# Patient Record
Sex: Female | Born: 1970 | Race: White | Hispanic: No | Marital: Married | Smoking: Never smoker
Health system: Southern US, Community
[De-identification: ages and names within clinical notes are randomized; demographics above are authoritative.]

## PROBLEM LIST (undated history)

## (undated) DIAGNOSIS — N8110 Cystocele, unspecified: Secondary | ICD-10-CM

## (undated) DIAGNOSIS — N840 Polyp of corpus uteri: Secondary | ICD-10-CM

## (undated) DIAGNOSIS — N390 Urinary tract infection, site not specified: Secondary | ICD-10-CM

## (undated) DIAGNOSIS — F419 Anxiety disorder, unspecified: Secondary | ICD-10-CM

## (undated) DIAGNOSIS — I10 Essential (primary) hypertension: Secondary | ICD-10-CM

## (undated) DIAGNOSIS — N12 Tubulo-interstitial nephritis, not specified as acute or chronic: Secondary | ICD-10-CM

## (undated) DIAGNOSIS — N920 Excessive and frequent menstruation with regular cycle: Secondary | ICD-10-CM

## (undated) DIAGNOSIS — IMO0002 Reserved for concepts with insufficient information to code with codable children: Secondary | ICD-10-CM

## (undated) DIAGNOSIS — B3731 Acute candidiasis of vulva and vagina: Secondary | ICD-10-CM

## (undated) DIAGNOSIS — M1990 Unspecified osteoarthritis, unspecified site: Secondary | ICD-10-CM

## (undated) DIAGNOSIS — N3090 Cystitis, unspecified without hematuria: Secondary | ICD-10-CM

## (undated) DIAGNOSIS — B373 Candidiasis of vulva and vagina: Secondary | ICD-10-CM

## (undated) HISTORY — PX: GYNECOLOGIC CRYOSURGERY: SHX857

## (undated) HISTORY — PX: WRIST SURGERY: SHX841

## (undated) HISTORY — PX: CHOLECYSTECTOMY: SHX55

## (undated) HISTORY — PX: CERVICAL POLYPECTOMY: SHX88

## (undated) HISTORY — PX: CARPAL TUNNEL RELEASE: SHX101

## (undated) HISTORY — PX: BLADDER SURGERY: SHX569

---

## 1999-06-12 ENCOUNTER — Other Ambulatory Visit: Admission: RE | Admit: 1999-06-12 | Discharge: 1999-06-12 | Payer: Self-pay | Admitting: Obstetrics & Gynecology

## 1999-09-02 ENCOUNTER — Inpatient Hospital Stay (HOSPITAL_COMMUNITY): Admission: AD | Admit: 1999-09-02 | Discharge: 1999-09-05 | Payer: Self-pay | Admitting: Family Medicine

## 1999-11-14 ENCOUNTER — Inpatient Hospital Stay (HOSPITAL_COMMUNITY): Admission: AD | Admit: 1999-11-14 | Discharge: 1999-11-17 | Payer: Self-pay | Admitting: Obstetrics & Gynecology

## 1999-11-17 ENCOUNTER — Encounter: Payer: Self-pay | Admitting: Obstetrics & Gynecology

## 1999-12-15 ENCOUNTER — Inpatient Hospital Stay (HOSPITAL_COMMUNITY): Admission: AD | Admit: 1999-12-15 | Discharge: 1999-12-15 | Payer: Self-pay | Admitting: Obstetrics and Gynecology

## 1999-12-27 ENCOUNTER — Inpatient Hospital Stay (HOSPITAL_COMMUNITY): Admission: AD | Admit: 1999-12-27 | Discharge: 1999-12-29 | Payer: Self-pay | Admitting: Obstetrics & Gynecology

## 2000-10-03 ENCOUNTER — Emergency Department (HOSPITAL_COMMUNITY): Admission: EM | Admit: 2000-10-03 | Discharge: 2000-10-03 | Payer: Self-pay | Admitting: Emergency Medicine

## 2001-10-21 ENCOUNTER — Encounter: Payer: Self-pay | Admitting: Specialist

## 2001-10-21 ENCOUNTER — Ambulatory Visit (HOSPITAL_COMMUNITY): Admission: RE | Admit: 2001-10-21 | Discharge: 2001-10-21 | Payer: Self-pay | Admitting: Specialist

## 2001-10-31 ENCOUNTER — Emergency Department (HOSPITAL_COMMUNITY): Admission: EM | Admit: 2001-10-31 | Discharge: 2001-10-31 | Payer: Self-pay | Admitting: *Deleted

## 2001-12-06 ENCOUNTER — Emergency Department (HOSPITAL_COMMUNITY): Admission: EM | Admit: 2001-12-06 | Discharge: 2001-12-06 | Payer: Self-pay | Admitting: Emergency Medicine

## 2002-06-03 ENCOUNTER — Ambulatory Visit (HOSPITAL_COMMUNITY): Admission: RE | Admit: 2002-06-03 | Discharge: 2002-06-03 | Payer: Self-pay | Admitting: Unknown Physician Specialty

## 2002-06-03 ENCOUNTER — Encounter: Payer: Self-pay | Admitting: Unknown Physician Specialty

## 2002-06-20 ENCOUNTER — Emergency Department (HOSPITAL_COMMUNITY): Admission: EM | Admit: 2002-06-20 | Discharge: 2002-06-20 | Payer: Self-pay | Admitting: Emergency Medicine

## 2002-10-18 ENCOUNTER — Ambulatory Visit (HOSPITAL_COMMUNITY): Admission: RE | Admit: 2002-10-18 | Discharge: 2002-10-18 | Payer: Self-pay | Admitting: Family Medicine

## 2002-10-18 ENCOUNTER — Encounter: Payer: Self-pay | Admitting: Family Medicine

## 2002-11-05 ENCOUNTER — Ambulatory Visit (HOSPITAL_COMMUNITY): Admission: RE | Admit: 2002-11-05 | Discharge: 2002-11-05 | Payer: Self-pay | Admitting: Family Medicine

## 2002-11-05 ENCOUNTER — Encounter: Payer: Self-pay | Admitting: Family Medicine

## 2003-03-27 ENCOUNTER — Emergency Department (HOSPITAL_COMMUNITY): Admission: EM | Admit: 2003-03-27 | Discharge: 2003-03-27 | Payer: Self-pay | Admitting: Emergency Medicine

## 2003-03-31 ENCOUNTER — Emergency Department (HOSPITAL_COMMUNITY): Admission: EM | Admit: 2003-03-31 | Discharge: 2003-04-02 | Payer: Self-pay | Admitting: Emergency Medicine

## 2003-06-26 ENCOUNTER — Emergency Department (HOSPITAL_COMMUNITY): Admission: EM | Admit: 2003-06-26 | Discharge: 2003-06-26 | Payer: Self-pay | Admitting: Emergency Medicine

## 2003-08-23 ENCOUNTER — Ambulatory Visit (HOSPITAL_COMMUNITY): Admission: RE | Admit: 2003-08-23 | Discharge: 2003-08-23 | Payer: Self-pay | Admitting: Internal Medicine

## 2004-01-17 ENCOUNTER — Ambulatory Visit: Payer: Self-pay | Admitting: Family Medicine

## 2004-01-31 ENCOUNTER — Ambulatory Visit: Payer: Self-pay | Admitting: Family Medicine

## 2004-02-02 ENCOUNTER — Observation Stay (HOSPITAL_COMMUNITY): Admission: EM | Admit: 2004-02-02 | Discharge: 2004-02-04 | Payer: Self-pay | Admitting: Emergency Medicine

## 2004-03-06 ENCOUNTER — Emergency Department (HOSPITAL_COMMUNITY): Admission: EM | Admit: 2004-03-06 | Discharge: 2004-03-06 | Payer: Self-pay | Admitting: Emergency Medicine

## 2004-03-26 ENCOUNTER — Ambulatory Visit: Payer: Self-pay | Admitting: Family Medicine

## 2004-04-09 ENCOUNTER — Ambulatory Visit: Payer: Self-pay | Admitting: Family Medicine

## 2004-05-28 ENCOUNTER — Ambulatory Visit: Payer: Self-pay | Admitting: Family Medicine

## 2004-07-03 ENCOUNTER — Ambulatory Visit: Payer: Self-pay | Admitting: Family Medicine

## 2004-07-05 ENCOUNTER — Emergency Department (HOSPITAL_COMMUNITY): Admission: EM | Admit: 2004-07-05 | Discharge: 2004-07-06 | Payer: Self-pay | Admitting: Emergency Medicine

## 2004-10-10 ENCOUNTER — Ambulatory Visit: Payer: Self-pay | Admitting: Family Medicine

## 2004-11-07 ENCOUNTER — Ambulatory Visit: Payer: Self-pay | Admitting: Family Medicine

## 2004-11-08 ENCOUNTER — Emergency Department (HOSPITAL_COMMUNITY): Admission: EM | Admit: 2004-11-08 | Discharge: 2004-11-08 | Payer: Self-pay | Admitting: Emergency Medicine

## 2004-11-12 ENCOUNTER — Ambulatory Visit: Payer: Self-pay | Admitting: Family Medicine

## 2004-11-15 ENCOUNTER — Ambulatory Visit (HOSPITAL_COMMUNITY): Admission: RE | Admit: 2004-11-15 | Discharge: 2004-11-15 | Payer: Self-pay | Admitting: Family Medicine

## 2004-11-19 ENCOUNTER — Ambulatory Visit: Payer: Self-pay | Admitting: Family Medicine

## 2004-12-09 ENCOUNTER — Emergency Department (HOSPITAL_COMMUNITY): Admission: EM | Admit: 2004-12-09 | Discharge: 2004-12-09 | Payer: Self-pay | Admitting: Emergency Medicine

## 2004-12-21 ENCOUNTER — Ambulatory Visit: Payer: Self-pay | Admitting: Family Medicine

## 2004-12-31 ENCOUNTER — Ambulatory Visit: Payer: Self-pay | Admitting: Family Medicine

## 2005-01-11 ENCOUNTER — Ambulatory Visit (HOSPITAL_COMMUNITY): Admission: RE | Admit: 2005-01-11 | Discharge: 2005-01-11 | Payer: Self-pay | Admitting: Surgery

## 2005-01-11 ENCOUNTER — Encounter (INDEPENDENT_AMBULATORY_CARE_PROVIDER_SITE_OTHER): Payer: Self-pay | Admitting: Specialist

## 2005-01-13 ENCOUNTER — Emergency Department (HOSPITAL_COMMUNITY): Admission: EM | Admit: 2005-01-13 | Discharge: 2005-01-14 | Payer: Self-pay | Admitting: Emergency Medicine

## 2005-01-14 ENCOUNTER — Ambulatory Visit (HOSPITAL_COMMUNITY): Admission: RE | Admit: 2005-01-14 | Discharge: 2005-01-14 | Payer: Self-pay | Admitting: General Surgery

## 2005-01-30 ENCOUNTER — Ambulatory Visit: Payer: Self-pay | Admitting: Family Medicine

## 2005-03-04 ENCOUNTER — Ambulatory Visit: Payer: Self-pay | Admitting: Family Medicine

## 2005-03-22 ENCOUNTER — Ambulatory Visit: Payer: Self-pay | Admitting: Family Medicine

## 2005-04-17 ENCOUNTER — Ambulatory Visit: Payer: Self-pay | Admitting: Family Medicine

## 2005-04-26 ENCOUNTER — Emergency Department (HOSPITAL_COMMUNITY): Admission: EM | Admit: 2005-04-26 | Discharge: 2005-04-27 | Payer: Self-pay | Admitting: Emergency Medicine

## 2005-04-30 ENCOUNTER — Emergency Department (HOSPITAL_COMMUNITY): Admission: EM | Admit: 2005-04-30 | Discharge: 2005-04-30 | Payer: Self-pay | Admitting: Emergency Medicine

## 2005-05-20 ENCOUNTER — Ambulatory Visit: Payer: Self-pay | Admitting: Family Medicine

## 2005-07-11 ENCOUNTER — Ambulatory Visit: Payer: Self-pay | Admitting: Family Medicine

## 2005-08-27 ENCOUNTER — Ambulatory Visit: Payer: Self-pay | Admitting: Family Medicine

## 2005-09-24 ENCOUNTER — Ambulatory Visit: Payer: Self-pay | Admitting: Family Medicine

## 2005-11-03 ENCOUNTER — Emergency Department (HOSPITAL_COMMUNITY): Admission: EM | Admit: 2005-11-03 | Discharge: 2005-11-03 | Payer: Self-pay | Admitting: Family Medicine

## 2005-11-07 ENCOUNTER — Emergency Department (HOSPITAL_COMMUNITY): Admission: EM | Admit: 2005-11-07 | Discharge: 2005-11-07 | Payer: Self-pay | Admitting: Family Medicine

## 2005-11-14 ENCOUNTER — Ambulatory Visit: Payer: Self-pay | Admitting: Family Medicine

## 2005-12-09 ENCOUNTER — Ambulatory Visit: Payer: Self-pay | Admitting: Family Medicine

## 2005-12-09 ENCOUNTER — Emergency Department (HOSPITAL_COMMUNITY): Admission: EM | Admit: 2005-12-09 | Discharge: 2005-12-09 | Payer: Self-pay | Admitting: Emergency Medicine

## 2005-12-10 ENCOUNTER — Emergency Department (HOSPITAL_COMMUNITY): Admission: EM | Admit: 2005-12-10 | Discharge: 2005-12-10 | Payer: Self-pay | Admitting: Emergency Medicine

## 2006-01-20 ENCOUNTER — Ambulatory Visit: Payer: Self-pay | Admitting: Family Medicine

## 2006-01-23 ENCOUNTER — Emergency Department (HOSPITAL_COMMUNITY): Admission: EM | Admit: 2006-01-23 | Discharge: 2006-01-23 | Payer: Self-pay | Admitting: Family Medicine

## 2006-01-30 ENCOUNTER — Ambulatory Visit: Payer: Self-pay | Admitting: Family Medicine

## 2006-04-06 ENCOUNTER — Emergency Department (HOSPITAL_COMMUNITY): Admission: EM | Admit: 2006-04-06 | Discharge: 2006-04-06 | Payer: Self-pay | Admitting: Family Medicine

## 2006-04-08 ENCOUNTER — Ambulatory Visit: Payer: Self-pay | Admitting: Family Medicine

## 2006-04-16 ENCOUNTER — Ambulatory Visit (HOSPITAL_COMMUNITY): Admission: RE | Admit: 2006-04-16 | Discharge: 2006-04-16 | Payer: Self-pay | Admitting: Obstetrics & Gynecology

## 2006-04-30 ENCOUNTER — Ambulatory Visit: Payer: Self-pay | Admitting: Family Medicine

## 2006-05-08 ENCOUNTER — Ambulatory Visit (HOSPITAL_COMMUNITY): Admission: RE | Admit: 2006-05-08 | Discharge: 2006-05-08 | Payer: Self-pay | Admitting: Obstetrics & Gynecology

## 2006-05-08 ENCOUNTER — Encounter (INDEPENDENT_AMBULATORY_CARE_PROVIDER_SITE_OTHER): Payer: Self-pay | Admitting: *Deleted

## 2006-05-25 ENCOUNTER — Emergency Department (HOSPITAL_COMMUNITY): Admission: EM | Admit: 2006-05-25 | Discharge: 2006-05-25 | Payer: Self-pay | Admitting: Family Medicine

## 2006-06-12 ENCOUNTER — Ambulatory Visit: Payer: Self-pay | Admitting: Family Medicine

## 2006-06-26 ENCOUNTER — Ambulatory Visit: Payer: Self-pay | Admitting: Family Medicine

## 2006-07-11 ENCOUNTER — Ambulatory Visit: Payer: Self-pay | Admitting: Family Medicine

## 2006-07-24 ENCOUNTER — Ambulatory Visit: Payer: Self-pay | Admitting: Family Medicine

## 2006-09-07 ENCOUNTER — Emergency Department (HOSPITAL_COMMUNITY): Admission: EM | Admit: 2006-09-07 | Discharge: 2006-09-07 | Payer: Self-pay | Admitting: Emergency Medicine

## 2006-09-22 ENCOUNTER — Emergency Department (HOSPITAL_COMMUNITY): Admission: EM | Admit: 2006-09-22 | Discharge: 2006-09-22 | Payer: Self-pay | Admitting: Emergency Medicine

## 2006-11-20 ENCOUNTER — Emergency Department (HOSPITAL_COMMUNITY): Admission: EM | Admit: 2006-11-20 | Discharge: 2006-11-20 | Payer: Self-pay | Admitting: Emergency Medicine

## 2006-12-11 ENCOUNTER — Emergency Department (HOSPITAL_COMMUNITY): Admission: EM | Admit: 2006-12-11 | Discharge: 2006-12-11 | Payer: Self-pay | Admitting: Emergency Medicine

## 2007-01-18 ENCOUNTER — Emergency Department (HOSPITAL_COMMUNITY): Admission: EM | Admit: 2007-01-18 | Discharge: 2007-01-18 | Payer: Self-pay | Admitting: Family Medicine

## 2007-01-21 ENCOUNTER — Emergency Department (HOSPITAL_COMMUNITY): Admission: EM | Admit: 2007-01-21 | Discharge: 2007-01-21 | Payer: Self-pay | Admitting: Emergency Medicine

## 2007-04-01 ENCOUNTER — Emergency Department (HOSPITAL_COMMUNITY): Admission: EM | Admit: 2007-04-01 | Discharge: 2007-04-01 | Payer: Self-pay | Admitting: Emergency Medicine

## 2007-05-10 ENCOUNTER — Emergency Department (HOSPITAL_COMMUNITY): Admission: EM | Admit: 2007-05-10 | Discharge: 2007-05-10 | Payer: Self-pay | Admitting: Emergency Medicine

## 2007-07-14 ENCOUNTER — Emergency Department (HOSPITAL_COMMUNITY): Admission: EM | Admit: 2007-07-14 | Discharge: 2007-07-14 | Payer: Self-pay | Admitting: Emergency Medicine

## 2007-07-19 ENCOUNTER — Emergency Department (HOSPITAL_COMMUNITY): Admission: EM | Admit: 2007-07-19 | Discharge: 2007-07-19 | Payer: Self-pay | Admitting: Family Medicine

## 2007-11-25 ENCOUNTER — Emergency Department (HOSPITAL_BASED_OUTPATIENT_CLINIC_OR_DEPARTMENT_OTHER): Admission: EM | Admit: 2007-11-25 | Discharge: 2007-11-25 | Payer: Self-pay | Admitting: Emergency Medicine

## 2008-01-03 ENCOUNTER — Emergency Department (HOSPITAL_BASED_OUTPATIENT_CLINIC_OR_DEPARTMENT_OTHER): Admission: EM | Admit: 2008-01-03 | Discharge: 2008-01-03 | Payer: Self-pay | Admitting: Emergency Medicine

## 2008-01-14 ENCOUNTER — Emergency Department (HOSPITAL_BASED_OUTPATIENT_CLINIC_OR_DEPARTMENT_OTHER): Admission: EM | Admit: 2008-01-14 | Discharge: 2008-01-14 | Payer: Self-pay | Admitting: Emergency Medicine

## 2008-03-28 ENCOUNTER — Emergency Department (HOSPITAL_BASED_OUTPATIENT_CLINIC_OR_DEPARTMENT_OTHER): Admission: EM | Admit: 2008-03-28 | Discharge: 2008-03-28 | Payer: Self-pay | Admitting: Emergency Medicine

## 2008-04-07 ENCOUNTER — Ambulatory Visit: Payer: Self-pay | Admitting: Diagnostic Radiology

## 2008-04-07 ENCOUNTER — Emergency Department (HOSPITAL_BASED_OUTPATIENT_CLINIC_OR_DEPARTMENT_OTHER): Admission: EM | Admit: 2008-04-07 | Discharge: 2008-04-07 | Payer: Self-pay | Admitting: Emergency Medicine

## 2008-04-29 ENCOUNTER — Emergency Department (HOSPITAL_BASED_OUTPATIENT_CLINIC_OR_DEPARTMENT_OTHER): Admission: EM | Admit: 2008-04-29 | Discharge: 2008-04-29 | Payer: Self-pay | Admitting: Emergency Medicine

## 2008-05-08 ENCOUNTER — Emergency Department (HOSPITAL_BASED_OUTPATIENT_CLINIC_OR_DEPARTMENT_OTHER): Admission: EM | Admit: 2008-05-08 | Discharge: 2008-05-08 | Payer: Self-pay | Admitting: Emergency Medicine

## 2008-06-12 ENCOUNTER — Emergency Department (HOSPITAL_BASED_OUTPATIENT_CLINIC_OR_DEPARTMENT_OTHER): Admission: EM | Admit: 2008-06-12 | Discharge: 2008-06-12 | Payer: Self-pay | Admitting: Emergency Medicine

## 2008-06-24 ENCOUNTER — Emergency Department (HOSPITAL_BASED_OUTPATIENT_CLINIC_OR_DEPARTMENT_OTHER): Admission: EM | Admit: 2008-06-24 | Discharge: 2008-06-24 | Payer: Self-pay | Admitting: Emergency Medicine

## 2008-07-26 ENCOUNTER — Emergency Department (HOSPITAL_BASED_OUTPATIENT_CLINIC_OR_DEPARTMENT_OTHER): Admission: EM | Admit: 2008-07-26 | Discharge: 2008-07-26 | Payer: Self-pay | Admitting: Emergency Medicine

## 2008-08-09 ENCOUNTER — Emergency Department (HOSPITAL_BASED_OUTPATIENT_CLINIC_OR_DEPARTMENT_OTHER): Admission: EM | Admit: 2008-08-09 | Discharge: 2008-08-09 | Payer: Self-pay | Admitting: Emergency Medicine

## 2008-09-24 ENCOUNTER — Emergency Department (HOSPITAL_BASED_OUTPATIENT_CLINIC_OR_DEPARTMENT_OTHER): Admission: EM | Admit: 2008-09-24 | Discharge: 2008-09-24 | Payer: Self-pay | Admitting: Emergency Medicine

## 2008-10-05 ENCOUNTER — Emergency Department (HOSPITAL_BASED_OUTPATIENT_CLINIC_OR_DEPARTMENT_OTHER): Admission: EM | Admit: 2008-10-05 | Discharge: 2008-10-05 | Payer: Self-pay | Admitting: Emergency Medicine

## 2008-11-30 ENCOUNTER — Emergency Department (HOSPITAL_BASED_OUTPATIENT_CLINIC_OR_DEPARTMENT_OTHER): Admission: EM | Admit: 2008-11-30 | Discharge: 2008-11-30 | Payer: Self-pay | Admitting: Emergency Medicine

## 2008-12-20 ENCOUNTER — Emergency Department (HOSPITAL_BASED_OUTPATIENT_CLINIC_OR_DEPARTMENT_OTHER): Admission: EM | Admit: 2008-12-20 | Discharge: 2008-12-20 | Payer: Self-pay | Admitting: Emergency Medicine

## 2008-12-30 ENCOUNTER — Emergency Department (HOSPITAL_BASED_OUTPATIENT_CLINIC_OR_DEPARTMENT_OTHER): Admission: EM | Admit: 2008-12-30 | Discharge: 2008-12-30 | Payer: Self-pay | Admitting: Emergency Medicine

## 2009-01-05 ENCOUNTER — Emergency Department (HOSPITAL_BASED_OUTPATIENT_CLINIC_OR_DEPARTMENT_OTHER): Admission: EM | Admit: 2009-01-05 | Discharge: 2009-01-05 | Payer: Self-pay | Admitting: Emergency Medicine

## 2009-01-17 ENCOUNTER — Emergency Department (HOSPITAL_BASED_OUTPATIENT_CLINIC_OR_DEPARTMENT_OTHER): Admission: EM | Admit: 2009-01-17 | Discharge: 2009-01-17 | Payer: Self-pay | Admitting: Emergency Medicine

## 2009-01-17 ENCOUNTER — Ambulatory Visit: Payer: Self-pay | Admitting: Diagnostic Radiology

## 2009-03-02 ENCOUNTER — Emergency Department (HOSPITAL_BASED_OUTPATIENT_CLINIC_OR_DEPARTMENT_OTHER): Admission: EM | Admit: 2009-03-02 | Discharge: 2009-03-02 | Payer: Self-pay | Admitting: Emergency Medicine

## 2009-03-11 ENCOUNTER — Ambulatory Visit: Payer: Self-pay | Admitting: Diagnostic Radiology

## 2009-03-11 ENCOUNTER — Emergency Department (HOSPITAL_BASED_OUTPATIENT_CLINIC_OR_DEPARTMENT_OTHER): Admission: EM | Admit: 2009-03-11 | Discharge: 2009-03-11 | Payer: Self-pay | Admitting: Emergency Medicine

## 2009-06-08 ENCOUNTER — Emergency Department (HOSPITAL_BASED_OUTPATIENT_CLINIC_OR_DEPARTMENT_OTHER): Admission: EM | Admit: 2009-06-08 | Discharge: 2009-06-08 | Payer: Self-pay | Admitting: Emergency Medicine

## 2009-06-11 ENCOUNTER — Emergency Department (HOSPITAL_BASED_OUTPATIENT_CLINIC_OR_DEPARTMENT_OTHER): Admission: EM | Admit: 2009-06-11 | Discharge: 2009-06-11 | Payer: Self-pay | Admitting: Emergency Medicine

## 2009-07-08 ENCOUNTER — Emergency Department (HOSPITAL_BASED_OUTPATIENT_CLINIC_OR_DEPARTMENT_OTHER): Admission: EM | Admit: 2009-07-08 | Discharge: 2009-07-08 | Payer: Self-pay | Admitting: Emergency Medicine

## 2009-08-24 ENCOUNTER — Emergency Department (HOSPITAL_BASED_OUTPATIENT_CLINIC_OR_DEPARTMENT_OTHER): Admission: EM | Admit: 2009-08-24 | Discharge: 2009-08-24 | Payer: Self-pay | Admitting: Emergency Medicine

## 2009-08-29 ENCOUNTER — Emergency Department (HOSPITAL_BASED_OUTPATIENT_CLINIC_OR_DEPARTMENT_OTHER)
Admission: EM | Admit: 2009-08-29 | Discharge: 2009-08-29 | Payer: Self-pay | Source: Home / Self Care | Admitting: Emergency Medicine

## 2009-09-10 ENCOUNTER — Emergency Department (HOSPITAL_BASED_OUTPATIENT_CLINIC_OR_DEPARTMENT_OTHER)
Admission: EM | Admit: 2009-09-10 | Discharge: 2009-09-10 | Payer: Self-pay | Source: Home / Self Care | Admitting: Emergency Medicine

## 2009-10-28 ENCOUNTER — Emergency Department (HOSPITAL_BASED_OUTPATIENT_CLINIC_OR_DEPARTMENT_OTHER): Admission: EM | Admit: 2009-10-28 | Discharge: 2009-10-28 | Payer: Self-pay | Admitting: Emergency Medicine

## 2009-11-13 ENCOUNTER — Emergency Department (HOSPITAL_BASED_OUTPATIENT_CLINIC_OR_DEPARTMENT_OTHER): Admission: EM | Admit: 2009-11-13 | Discharge: 2009-11-13 | Payer: Self-pay | Admitting: Emergency Medicine

## 2009-11-17 ENCOUNTER — Emergency Department (HOSPITAL_BASED_OUTPATIENT_CLINIC_OR_DEPARTMENT_OTHER): Admission: EM | Admit: 2009-11-17 | Discharge: 2009-11-17 | Payer: Self-pay | Admitting: Emergency Medicine

## 2009-12-15 ENCOUNTER — Emergency Department (HOSPITAL_BASED_OUTPATIENT_CLINIC_OR_DEPARTMENT_OTHER): Admission: EM | Admit: 2009-12-15 | Discharge: 2009-12-15 | Payer: Self-pay | Admitting: Emergency Medicine

## 2009-12-15 ENCOUNTER — Ambulatory Visit: Payer: Self-pay | Admitting: Diagnostic Radiology

## 2010-01-03 ENCOUNTER — Inpatient Hospital Stay (HOSPITAL_COMMUNITY): Admission: AD | Admit: 2010-01-03 | Discharge: 2010-01-03 | Payer: Self-pay | Admitting: Obstetrics & Gynecology

## 2010-02-09 ENCOUNTER — Ambulatory Visit (HOSPITAL_COMMUNITY)
Admission: RE | Admit: 2010-02-09 | Discharge: 2010-02-09 | Payer: Self-pay | Source: Home / Self Care | Attending: Obstetrics | Admitting: Obstetrics

## 2010-03-06 ENCOUNTER — Observation Stay (HOSPITAL_COMMUNITY)
Admission: EM | Admit: 2010-03-06 | Discharge: 2010-03-07 | Payer: Self-pay | Source: Home / Self Care | Admitting: Emergency Medicine

## 2010-03-07 ENCOUNTER — Encounter (INDEPENDENT_AMBULATORY_CARE_PROVIDER_SITE_OTHER): Payer: Self-pay | Admitting: Emergency Medicine

## 2010-03-10 ENCOUNTER — Emergency Department (HOSPITAL_BASED_OUTPATIENT_CLINIC_OR_DEPARTMENT_OTHER)
Admission: EM | Admit: 2010-03-10 | Discharge: 2010-03-10 | Payer: Self-pay | Source: Home / Self Care | Admitting: Emergency Medicine

## 2010-03-12 LAB — URINALYSIS, ROUTINE W REFLEX MICROSCOPIC
Bilirubin Urine: NEGATIVE
Bilirubin Urine: NEGATIVE
Hgb urine dipstick: NEGATIVE
Ketones, ur: NEGATIVE mg/dL
Ketones, ur: NEGATIVE mg/dL
Nitrite: NEGATIVE
Nitrite: NEGATIVE
Protein, ur: NEGATIVE mg/dL
Protein, ur: NEGATIVE mg/dL
Specific Gravity, Urine: 1.008 (ref 1.005–1.030)
Specific Gravity, Urine: 1.023 (ref 1.005–1.030)
Urine Glucose, Fasting: NEGATIVE mg/dL
Urine Glucose, Fasting: NEGATIVE mg/dL
Urobilinogen, UA: 0.2 mg/dL (ref 0.0–1.0)
Urobilinogen, UA: 0.2 mg/dL (ref 0.0–1.0)
pH: 6 (ref 5.0–8.0)
pH: 5.5 (ref 5.0–8.0)

## 2010-03-12 LAB — DIFFERENTIAL
Basophils Absolute: 0 10*3/uL (ref 0.0–0.1)
Basophils Relative: 0 % (ref 0–1)
Eosinophils Absolute: 0.1 10*3/uL (ref 0.0–0.7)
Eosinophils Relative: 2 % (ref 0–5)
Lymphocytes Relative: 33 % (ref 12–46)
Lymphs Abs: 2.5 10*3/uL (ref 0.7–4.0)
Monocytes Absolute: 0.5 10*3/uL (ref 0.1–1.0)
Monocytes Relative: 7 % (ref 3–12)
Neutro Abs: 4.5 10*3/uL (ref 1.7–7.7)
Neutrophils Relative %: 59 % (ref 43–77)

## 2010-03-12 LAB — CBC
HCT: 39.4 % (ref 36.0–46.0)
Hemoglobin: 13 g/dL (ref 12.0–15.0)
MCH: 29.3 pg (ref 26.0–34.0)
MCHC: 33 g/dL (ref 30.0–36.0)
MCV: 88.9 fL (ref 78.0–100.0)
Platelets: 197 10*3/uL (ref 150–400)
RBC: 4.43 MIL/uL (ref 3.87–5.11)
RDW: 13 % (ref 11.5–15.5)
WBC: 7.7 10*3/uL (ref 4.0–10.5)

## 2010-03-12 LAB — URINE MICROSCOPIC-ADD ON

## 2010-03-12 LAB — POCT CARDIAC MARKERS
CKMB, poc: <1 ng/mL — ABNORMAL LOW (ref 1.0–8.0)
CKMB, poc: <1 ng/mL — ABNORMAL LOW (ref 1.0–8.0)
CKMB, poc: <1 ng/mL — ABNORMAL LOW (ref 1.0–8.0)
Myoglobin, poc: 60.7 ng/mL (ref 12–200)
Myoglobin, poc: 57.7 ng/mL (ref 12–200)
Myoglobin, poc: 56.1 ng/mL (ref 12–200)
Troponin i, poc: <0.05 ng/mL (ref 0.00–0.09)
Troponin i, poc: <0.05 ng/mL (ref 0.00–0.09)
Troponin i, poc: <0.05 ng/mL (ref 0.00–0.09)

## 2010-03-12 LAB — POCT I-STAT, CHEM 8
BUN: 9 mg/dL (ref 6–23)
Calcium, Ion: 1.17 mmol/L (ref 1.12–1.32)
Chloride: 108 mEq/L (ref 96–112)
Creatinine, Ser: 1 mg/dL (ref 0.4–1.2)
Glucose, Bld: 96 mg/dL (ref 70–99)
HCT: 40 % (ref 36.0–46.0)
Hemoglobin: 13.6 g/dL (ref 12.0–15.0)
Potassium: 3.8 mEq/L (ref 3.5–5.1)
Sodium: 143 mEq/L (ref 135–145)
TCO2: 26 mmol/L (ref 0–100)

## 2010-03-12 LAB — PREGNANCY, URINE
Preg Test, Ur: NEGATIVE
Preg Test, Ur: NEGATIVE

## 2010-03-14 LAB — URINE CULTURE
Colony Count: NO GROWTH
Culture  Setup Time: 201201150049
Culture: NO GROWTH

## 2010-04-02 ENCOUNTER — Encounter: Payer: Self-pay | Admitting: Emergency Medicine

## 2010-04-02 ENCOUNTER — Ambulatory Visit (INDEPENDENT_AMBULATORY_CARE_PROVIDER_SITE_OTHER): Payer: Medicaid Other | Admitting: Emergency Medicine

## 2010-04-02 DIAGNOSIS — J209 Acute bronchitis, unspecified: Secondary | ICD-10-CM | POA: Insufficient documentation

## 2010-04-02 DIAGNOSIS — R109 Unspecified abdominal pain: Secondary | ICD-10-CM

## 2010-04-02 LAB — CONVERTED CEMR LAB
Bilirubin Urine: NEGATIVE
Blood in Urine, dipstick: NEGATIVE
Glucose, Urine, Semiquant: NEGATIVE
Ketones, urine, test strip: NEGATIVE
Nitrite: NEGATIVE
Protein, U semiquant: NEGATIVE
Specific Gravity, Urine: 1.02
Urobilinogen, UA: 1
pH: 7

## 2010-04-03 ENCOUNTER — Encounter: Payer: Self-pay | Admitting: Emergency Medicine

## 2010-04-04 ENCOUNTER — Telehealth (INDEPENDENT_AMBULATORY_CARE_PROVIDER_SITE_OTHER): Payer: Self-pay | Admitting: *Deleted

## 2010-04-07 ENCOUNTER — Emergency Department (HOSPITAL_BASED_OUTPATIENT_CLINIC_OR_DEPARTMENT_OTHER)
Admission: EM | Admit: 2010-04-07 | Discharge: 2010-04-07 | Disposition: A | Discharge status: Home / Self Care | Payer: Medicaid Other | Attending: Emergency Medicine | Admitting: Emergency Medicine

## 2010-04-07 DIAGNOSIS — N12 Tubulo-interstitial nephritis, not specified as acute or chronic: Secondary | ICD-10-CM | POA: Insufficient documentation

## 2010-04-07 DIAGNOSIS — M549 Dorsalgia, unspecified: Secondary | ICD-10-CM | POA: Insufficient documentation

## 2010-04-07 LAB — BASIC METABOLIC PANEL
CO2: 26 mEq/L (ref 19–32)
Calcium: 9.1 mg/dL (ref 8.4–10.5)
GFR calc Af Amer: >60 mL/min (ref >60)
Potassium: 4.2 mEq/L (ref 3.5–5.1)
Sodium: 149 mEq/L — ABNORMAL HIGH (ref 135–145)

## 2010-04-07 LAB — URINALYSIS, ROUTINE W REFLEX MICROSCOPIC
Ketones, ur: NEGATIVE mg/dL
Protein, ur: NEGATIVE mg/dL
Urine Glucose, Fasting: NEGATIVE mg/dL
pH: 6 (ref 5.0–8.0)

## 2010-04-07 LAB — URINE MICROSCOPIC-ADD ON

## 2010-04-07 LAB — DIFFERENTIAL
Basophils Absolute: 0 10*3/uL (ref 0.0–0.1)
Basophils Relative: 0 % (ref 0–1)
Eosinophils Absolute: 0.1 10*3/uL (ref 0.0–0.7)
Eosinophils Relative: 2 % (ref 0–5)
Monocytes Relative: 9 % (ref 3–12)
Neutro Abs: 3.5 10*3/uL (ref 1.7–7.7)

## 2010-04-07 LAB — CBC
HCT: 37.6 % (ref 36.0–46.0)
Hemoglobin: 12.6 g/dL (ref 12.0–15.0)
MCHC: 33.5 g/dL (ref 30.0–36.0)
RDW: 12.7 % (ref 11.5–15.5)
WBC: 6.8 10*3/uL (ref 4.0–10.5)

## 2010-04-07 LAB — PREGNANCY, URINE: Preg Test, Ur: NEGATIVE

## 2010-04-12 NOTE — Progress Notes (Signed)
  Phone Note Outgoing Call   Call placed by: Clemens Catholic LPN,  April 04, 2010 1:54 PM Call placed to: pts daughter Summary of Call: call back: spoke to pts daughter she states that the pt just picked up her ABT from the pharmacy to start it today. advised her to have pt tocall back if any questions or concerns. Initial call taken by: Clemens Catholic LPN,  April 04, 2010 1:55 PM

## 2010-04-12 NOTE — Assessment & Plan Note (Signed)
Summary: Sick,dry cough,runny nose/wse (rm 3)   Vital Signs:  Patient Profile:   39 Years Old Female CC:      dry cough, runny nose, right flank pain x 3 days Height:     63 inches Weight:      236 pounds O2 Sat:      98 % O2 treatment:    Room Air Temp:     99.0 degrees F oral Pulse rate:   99 / minute Resp:     16 per minute BP sitting:   128 / 84  (left arm) Cuff size:   large  Pt. in pain?   yes    Location:   right flank    Intensity:   7    Type:       sharp  Vitals Entered By: Lajean Saver RN (April 02, 2010 5:04 PM)                   Updated Prior Medication List: No Medications Current Allergies: ASAHistory of Present Illness Chief Complaint: dry cough, runny nose, right flank pain x 3 days History of Present Illness: Pt complains of  3 days of chest and sinus congestion. Scant colored sputum and nasal mucus. No sore throat. + hacking, dry cough, worse at night. No dyspnea. No chest pain. No wheezing.  No nausea No vomiting. No fever, No chills. Also c/o R flank pain for 3 days.  7/10. With dysuria yesterday, but not today. Hx of "kidney/bladder infections " in the past. Her renal MD is Dr. Annabell Howells. LMP nl 3 wks ago.  REVIEW OF SYSTEMS Constitutional Symptoms      Denies fever, chills, night sweats, weight loss, weight gain, and fatigue.  Eyes       Denies change in vision, eye pain, eye discharge, glasses, contact lenses, and eye surgery. Ear/Nose/Throat/Mouth       Complains of frequent runny nose.      Denies hearing loss/aids, change in hearing, ear pain, ear discharge, dizziness, frequent nose bleeds, sinus problems, sore throat, hoarseness, and tooth pain or bleeding.  Respiratory       Complains of dry cough.      Denies productive cough, wheezing, shortness of breath, asthma, bronchitis, and emphysema/COPD.  Cardiovascular       Denies murmurs, chest pain, and tires easily with exhertion.    Gastrointestinal       Complains of  nausea/vomiting.      Denies stomach pain, diarrhea, constipation, blood in bowel movements, and indigestion.      Comments: nausea only Genitourniary       Denies painful urination, kidney stones, and loss of urinary control. Neurological       Denies paralysis, seizures, and fainting/blackouts. Musculoskeletal       Denies muscle pain, joint pain, joint stiffness, decreased range of motion, redness, swelling, muscle weakness, and gout.      Comments: right flank pain Skin       Denies bruising, unusual mles/lumps or sores, and hair/skin or nail changes.  Psych       Denies mood changes, temper/anger issues, anxiety/stress, speech problems, depression, and sleep problems. Other Comments: Patient c/o dry cough and runny nose 3 days ago. She also developed right flank pain about 3 days ago. She has a hx of frequent kidney infections. Denies dysuria now.   Past History:  Past Medical History: Frequent kindey/bladder infections/misshapen bladder- Renal MD: Dr. Annabell Howells  Past Surgical History: Cholecystectomy polyps removed from  uterus  Family History: none  Social History: Married Never Smoked Alcohol use-no Drug use-no Dips snuffSmoking Status:  never Drug Use:  no Physical Exam General appearance: obese, well developed, well nourished, no acute distress. Fatigued. coughing. Here with husband. Head: normocephalic, atraumatic Eyes: conjunctivae and lids normal Pupils: equal, round, reactive to light Ears: normal, no lesions or deformities Nasal: swollen red turbinates with congestion Oral/Pharynx: tongue normal, posterior pharynx without erythema or exudate Neck: neck supple,  trachea midline, no masses Chest/Lungs: scattered rhonchi. But no wheezes or rales. Heart: regular rate and  rhythm, no murmur Abdomen: soft, non-tender without obvious organomegaly Back: tender musculature right thoracic Skin: no obvious rashes or lesions Minimal tenderness R paralumbar and R  CVA Assessment New Problems: ACUTE BRONCHITIS (ICD-466.0) FLANK PAIN, RIGHT (ICD-789.09)  Flank pain likely muscular, but UA is cloudy with trace leukocytes. So, possible mild R pyelonephritis.  Patient Education: Patient and/or caregiver instructed in the following: rest, fluids, Tylenol prn.  Plan New Medications/Changes: CIPRO 500 MG TABS (CIPROFLOXACIN HCL) 1 BID for 10 days  #20 x 0, 04/02/2010, Lajean Manes MD PROMETHAZINE-CODEINE 6.25-10 MG/5ML SYRP (PROMETHAZINE-CODEINE) 1 or 2 tsp hs as needed cough  #4 oz x 0, 04/02/2010, Lajean Manes MD  New Orders: T-Culture, Urine [[id number]] Est. Patient Level IV [09811] UA Dipstick w/o Micro (automated)  [81003] Planning Comments:   Send off urine culture. Discussed rx options. Risks, benefits, alternatives discussed. Pt and husband voiced understanding and agreement. Pt and husband agree with plans.----Intitially, I wrote for Levaquin 500 mg. daily for 10 days. This required PA, per phone message from pharmacy, so Levaquin changed to Cipro at pt's request. Risks, benefits, alternatives discussed. Pt voiced understanding and agreement.  Follow Up: Follow up in 2-3 days if no improvement, Follow up with Primary Physician Follow Up: also with Dr. Annabell Howells, her urologist within 1 week.  The patient and/or caregiver has been counseled thoroughly with regard to medications prescribed including dosage, schedule, interactions, rationale for use, and possible side effects and they verbalize understanding.  Diagnoses and expected course of recovery discussed and will return if not improved as expected or if the condition worsens. Patient and/or caregiver verbalized understanding.  Prescriptions: CIPRO 500 MG TABS (CIPROFLOXACIN HCL) 1 BID for 10 days  #20 x 0   Entered and Authorized by:   Lajean Manes MD   Signed by:   Lajean Manes MD on 04/02/2010   Method used:   Handwritten   RxID:   [id number] PROMETHAZINE-CODEINE 6.25-10 MG/5ML SYRP  (PROMETHAZINE-CODEINE) 1 or 2 tsp hs as needed cough  #4 oz x 0   Entered and Authorized by:   Lajean Manes MD   Signed by:   Lajean Manes MD on 04/02/2010   Method used:   Handwritten   RxID:   [id number]   Orders Added: 1)  T-Culture, Urine [[id number]] 2)  Est. Patient Level IV [64403] 3)  UA Dipstick w/o Micro (automated)  [81003]    Laboratory Results   Urine Tests  Date/Time Received: April 02, 2010 5:18 PM  Date/Time Reported: April 02, 2010 5:18 PM   Routine Urinalysis   Color: yellow Appearance: Cloudy Glucose: negative   (Normal Range: Negative) Bilirubin: negative   (Normal Range: Negative) Ketone: negative   (Normal Range: Negative) Spec. Gravity: 1.020   (Normal Range: 1.003-1.035) Blood: negative   (Normal Range: Negative) pH: 7.0   (Normal Range: 5.0-8.0) Protein: negative   (Normal Range: Negative) Urobilinogen: 1.0   (Normal Range: 0-1) Nitrite:  negative   (Normal Range: Negative) Leukocyte Esterace: trace   (Normal Range: Negative)

## 2010-05-08 ENCOUNTER — Emergency Department (HOSPITAL_BASED_OUTPATIENT_CLINIC_OR_DEPARTMENT_OTHER)
Admission: EM | Admit: 2010-05-08 | Discharge: 2010-05-08 | Disposition: A | Discharge status: Home / Self Care | Payer: Medicaid Other | Attending: Emergency Medicine | Admitting: Emergency Medicine

## 2010-05-08 DIAGNOSIS — W19XXXA Unspecified fall, initial encounter: Secondary | ICD-10-CM | POA: Insufficient documentation

## 2010-05-08 DIAGNOSIS — T148XXA Other injury of unspecified body region, initial encounter: Secondary | ICD-10-CM | POA: Insufficient documentation

## 2010-05-08 DIAGNOSIS — G8929 Other chronic pain: Secondary | ICD-10-CM | POA: Insufficient documentation

## 2010-05-08 LAB — URINALYSIS, ROUTINE W REFLEX MICROSCOPIC
Bilirubin Urine: NEGATIVE
Bilirubin Urine: NEGATIVE
Glucose, UA: NEGATIVE mg/dL
Ketones, ur: NEGATIVE mg/dL
Ketones, ur: NEGATIVE mg/dL
Leukocytes, UA: NEGATIVE
Nitrite: NEGATIVE
Protein, ur: NEGATIVE mg/dL
Specific Gravity, Urine: >1.03 — ABNORMAL HIGH (ref 1.005–1.030)
Specific Gravity, Urine: 1.003 — ABNORMAL LOW (ref 1.005–1.030)
Urobilinogen, UA: 0.2 mg/dL (ref 0.0–1.0)

## 2010-05-08 LAB — URINE CULTURE: Culture  Setup Time: 201111100442

## 2010-05-09 LAB — URINALYSIS, ROUTINE W REFLEX MICROSCOPIC
Bilirubin Urine: NEGATIVE
Hgb urine dipstick: NEGATIVE
Specific Gravity, Urine: 1.016 (ref 1.005–1.030)
pH: 6 (ref 5.0–8.0)

## 2010-05-10 LAB — DIFFERENTIAL
Eosinophils Relative: 1 % (ref 0–5)
Lymphocytes Relative: 26 % (ref 12–46)
Lymphocytes Relative: 35 % (ref 12–46)
Lymphs Abs: 2.1 10*3/uL (ref 0.7–4.0)
Monocytes Absolute: 0.8 10*3/uL (ref 0.1–1.0)
Monocytes Relative: 9 % (ref 3–12)
Neutro Abs: 4.3 10*3/uL (ref 1.7–7.7)
Neutrophils Relative %: 67 % (ref 43–77)

## 2010-05-10 LAB — URINE CULTURE

## 2010-05-10 LAB — URINALYSIS, ROUTINE W REFLEX MICROSCOPIC
Glucose, UA: NEGATIVE mg/dL
Glucose, UA: NEGATIVE mg/dL
Glucose, UA: NEGATIVE mg/dL
Hgb urine dipstick: NEGATIVE
Hgb urine dipstick: NEGATIVE
Specific Gravity, Urine: 1.016 (ref 1.005–1.030)
Specific Gravity, Urine: 1.017 (ref 1.005–1.030)
Urobilinogen, UA: 0.2 mg/dL (ref 0.0–1.0)
pH: 5.5 (ref 5.0–8.0)
pH: 6 (ref 5.0–8.0)

## 2010-05-10 LAB — WET PREP, GENITAL
Trich, Wet Prep: NONE SEEN
Yeast Wet Prep HPF POC: NONE SEEN

## 2010-05-10 LAB — CBC
HCT: 37.8 % (ref 36.0–46.0)
Hemoglobin: 13.1 g/dL (ref 12.0–15.0)
MCV: 88.3 fL (ref 78.0–100.0)
Platelets: 226 10*3/uL (ref 150–400)
RBC: 4.28 MIL/uL (ref 3.87–5.11)
RBC: 4.32 MIL/uL (ref 3.87–5.11)
WBC: 8 10*3/uL (ref 4.0–10.5)
WBC: 8.1 10*3/uL (ref 4.0–10.5)

## 2010-05-10 LAB — BASIC METABOLIC PANEL
Chloride: 110 mEq/L (ref 96–112)
Creatinine, Ser: 0.9 mg/dL (ref 0.4–1.2)
GFR calc Af Amer: >60 mL/min (ref >60)
Potassium: 3.9 mEq/L (ref 3.5–5.1)
Sodium: 144 mEq/L (ref 135–145)

## 2010-05-10 LAB — GC/CHLAMYDIA PROBE AMP, GENITAL: GC Probe Amp, Genital: NEGATIVE

## 2010-05-10 LAB — URINE MICROSCOPIC-ADD ON

## 2010-05-10 LAB — PREGNANCY, URINE: Preg Test, Ur: NEGATIVE

## 2010-05-12 LAB — URINALYSIS, ROUTINE W REFLEX MICROSCOPIC
Bilirubin Urine: NEGATIVE
Glucose, UA: NEGATIVE mg/dL
Specific Gravity, Urine: 1.014 (ref 1.005–1.030)
Urobilinogen, UA: 1 mg/dL (ref 0.0–1.0)

## 2010-05-12 LAB — URINE MICROSCOPIC-ADD ON

## 2010-05-13 LAB — DIFFERENTIAL
Basophils Absolute: 0 10*3/uL (ref 0.0–0.1)
Basophils Relative: 1 % (ref 0–1)
Eosinophils Absolute: 0.1 10*3/uL (ref 0.0–0.7)
Monocytes Absolute: 0.4 10*3/uL (ref 0.1–1.0)
Monocytes Relative: 6 % (ref 3–12)
Neutro Abs: 5.1 10*3/uL (ref 1.7–7.7)
Neutrophils Relative %: 68 % (ref 43–77)

## 2010-05-13 LAB — BASIC METABOLIC PANEL
BUN: 7 mg/dL (ref 6–23)
Calcium: 9.3 mg/dL (ref 8.4–10.5)
Creatinine, Ser: 0.8 mg/dL (ref 0.4–1.2)
GFR calc non Af Amer: >60 mL/min (ref >60)
Glucose, Bld: 145 mg/dL — ABNORMAL HIGH (ref 70–99)

## 2010-05-13 LAB — URINALYSIS, ROUTINE W REFLEX MICROSCOPIC
Bilirubin Urine: NEGATIVE
Glucose, UA: NEGATIVE mg/dL
Glucose, UA: NEGATIVE mg/dL
Hgb urine dipstick: NEGATIVE
Hgb urine dipstick: NEGATIVE
Ketones, ur: NEGATIVE mg/dL
Ketones, ur: NEGATIVE mg/dL
Nitrite: NEGATIVE
Nitrite: NEGATIVE
Protein, ur: NEGATIVE mg/dL
Specific Gravity, Urine: 1.024 (ref 1.005–1.030)
Specific Gravity, Urine: 1.024 (ref 1.005–1.030)
Urobilinogen, UA: 0.2 mg/dL (ref 0.0–1.0)
Urobilinogen, UA: 0.2 mg/dL (ref 0.0–1.0)
pH: 5.5 (ref 5.0–8.0)
pH: 6 (ref 5.0–8.0)

## 2010-05-13 LAB — URINE MICROSCOPIC-ADD ON

## 2010-05-13 LAB — CBC
MCHC: 33.9 g/dL (ref 30.0–36.0)
MCV: 90 fL (ref 78.0–100.0)
RBC: 4.63 MIL/uL (ref 3.87–5.11)
RDW: 12.1 % (ref 11.5–15.5)

## 2010-05-13 LAB — URINE CULTURE

## 2010-05-13 LAB — PREGNANCY, URINE
Preg Test, Ur: NEGATIVE
Preg Test, Ur: NEGATIVE

## 2010-05-13 LAB — WET PREP, GENITAL
Clue Cells Wet Prep HPF POC: NONE SEEN
Trich, Wet Prep: NONE SEEN

## 2010-05-15 LAB — URINALYSIS, ROUTINE W REFLEX MICROSCOPIC
Glucose, UA: NEGATIVE mg/dL
Ketones, ur: NEGATIVE mg/dL
Leukocytes, UA: NEGATIVE
Nitrite: POSITIVE — AB
Nitrite: NEGATIVE
Protein, ur: NEGATIVE mg/dL
Urobilinogen, UA: 1 mg/dL (ref 0.0–1.0)
pH: 6.5 (ref 5.0–8.0)

## 2010-05-15 LAB — WET PREP, GENITAL

## 2010-05-15 LAB — GC/CHLAMYDIA PROBE AMP, GENITAL
Chlamydia, DNA Probe: NEGATIVE
GC Probe Amp, Genital: NEGATIVE

## 2010-05-15 LAB — URINE MICROSCOPIC-ADD ON

## 2010-05-15 LAB — URINE CULTURE
Colony Count: NO GROWTH
Culture: NO GROWTH

## 2010-05-15 LAB — PREGNANCY, URINE: Preg Test, Ur: NEGATIVE

## 2010-05-16 LAB — URINE CULTURE: Colony Count: >=100000

## 2010-05-16 LAB — URINALYSIS, ROUTINE W REFLEX MICROSCOPIC
Ketones, ur: NEGATIVE mg/dL
Nitrite: POSITIVE — AB
Protein, ur: NEGATIVE mg/dL

## 2010-05-16 LAB — PREGNANCY, URINE: Preg Test, Ur: NEGATIVE

## 2010-05-26 ENCOUNTER — Emergency Department (HOSPITAL_BASED_OUTPATIENT_CLINIC_OR_DEPARTMENT_OTHER)
Admission: EM | Admit: 2010-05-26 | Discharge: 2010-05-26 | Disposition: A | Discharge status: Home / Self Care | Payer: Medicaid Other | Attending: Emergency Medicine | Admitting: Emergency Medicine

## 2010-05-26 DIAGNOSIS — G8929 Other chronic pain: Secondary | ICD-10-CM | POA: Insufficient documentation

## 2010-05-26 DIAGNOSIS — R109 Unspecified abdominal pain: Secondary | ICD-10-CM | POA: Insufficient documentation

## 2010-05-26 DIAGNOSIS — N390 Urinary tract infection, site not specified: Secondary | ICD-10-CM | POA: Insufficient documentation

## 2010-05-26 LAB — URINALYSIS, ROUTINE W REFLEX MICROSCOPIC
Bilirubin Urine: NEGATIVE
Glucose, UA: NEGATIVE mg/dL
Ketones, ur: NEGATIVE mg/dL
Nitrite: POSITIVE — AB
Protein, ur: NEGATIVE mg/dL

## 2010-05-26 LAB — PREGNANCY, URINE: Preg Test, Ur: NEGATIVE

## 2010-05-26 LAB — URINE MICROSCOPIC-ADD ON

## 2010-05-28 LAB — URINE CULTURE: Culture  Setup Time: 201203312159

## 2010-05-30 LAB — URINE CULTURE: Colony Count: 45000

## 2010-05-30 LAB — URINALYSIS, ROUTINE W REFLEX MICROSCOPIC
Bilirubin Urine: NEGATIVE
Glucose, UA: NEGATIVE mg/dL
Ketones, ur: NEGATIVE mg/dL
Ketones, ur: NEGATIVE mg/dL
Nitrite: NEGATIVE
Nitrite: NEGATIVE
Protein, ur: NEGATIVE mg/dL
Protein, ur: NEGATIVE mg/dL
Urobilinogen, UA: 0.2 mg/dL (ref 0.0–1.0)
Urobilinogen, UA: 0.2 mg/dL (ref 0.0–1.0)

## 2010-05-30 LAB — URINE MICROSCOPIC-ADD ON

## 2010-05-30 LAB — PREGNANCY, URINE: Preg Test, Ur: NEGATIVE

## 2010-05-31 LAB — URINE MICROSCOPIC-ADD ON

## 2010-05-31 LAB — URINALYSIS, ROUTINE W REFLEX MICROSCOPIC
Bilirubin Urine: NEGATIVE
Bilirubin Urine: NEGATIVE
Glucose, UA: NEGATIVE mg/dL
Glucose, UA: NEGATIVE mg/dL
Hgb urine dipstick: NEGATIVE
Leukocytes, UA: NEGATIVE
Nitrite: POSITIVE — AB
Protein, ur: NEGATIVE mg/dL
Specific Gravity, Urine: 1.023 (ref 1.005–1.030)
pH: 7 (ref 5.0–8.0)

## 2010-05-31 LAB — PREGNANCY, URINE: Preg Test, Ur: NEGATIVE

## 2010-05-31 LAB — URINE CULTURE: Colony Count: >=100000

## 2010-06-02 LAB — URINE CULTURE

## 2010-06-02 LAB — RPR: RPR Ser Ql: NONREACTIVE

## 2010-06-02 LAB — URINE MICROSCOPIC-ADD ON

## 2010-06-02 LAB — URINALYSIS, ROUTINE W REFLEX MICROSCOPIC
Nitrite: NEGATIVE
Specific Gravity, Urine: 1.02 (ref 1.005–1.030)
pH: 8 (ref 5.0–8.0)

## 2010-06-02 LAB — GC/CHLAMYDIA PROBE AMP, GENITAL
Chlamydia, DNA Probe: NEGATIVE
GC Probe Amp, Genital: NEGATIVE

## 2010-06-02 LAB — PREGNANCY, URINE: Preg Test, Ur: NEGATIVE

## 2010-06-02 LAB — WET PREP, GENITAL

## 2010-06-03 LAB — URINALYSIS, ROUTINE W REFLEX MICROSCOPIC
Glucose, UA: NEGATIVE mg/dL
Specific Gravity, Urine: 1.017 (ref 1.005–1.030)
Urobilinogen, UA: 0.2 mg/dL (ref 0.0–1.0)

## 2010-06-03 LAB — URINE MICROSCOPIC-ADD ON

## 2010-06-04 LAB — URINALYSIS, ROUTINE W REFLEX MICROSCOPIC
Bilirubin Urine: NEGATIVE
Ketones, ur: NEGATIVE mg/dL
Ketones, ur: NEGATIVE mg/dL
Nitrite: NEGATIVE
Nitrite: NEGATIVE
Protein, ur: NEGATIVE mg/dL
Urobilinogen, UA: 0.2 mg/dL (ref 0.0–1.0)
pH: 6 (ref 5.0–8.0)

## 2010-06-04 LAB — URINE CULTURE

## 2010-06-04 LAB — PREGNANCY, URINE
Preg Test, Ur: NEGATIVE
Preg Test, Ur: NEGATIVE

## 2010-06-04 LAB — URINE MICROSCOPIC-ADD ON

## 2010-06-06 LAB — PREGNANCY, URINE: Preg Test, Ur: NEGATIVE

## 2010-06-06 LAB — URINALYSIS, ROUTINE W REFLEX MICROSCOPIC
Bilirubin Urine: NEGATIVE
Hgb urine dipstick: NEGATIVE
Ketones, ur: NEGATIVE mg/dL
Nitrite: NEGATIVE
Nitrite: POSITIVE — AB
Protein, ur: NEGATIVE mg/dL
Specific Gravity, Urine: 1.018 (ref 1.005–1.030)
pH: 7.5 (ref 5.0–8.0)

## 2010-06-07 LAB — URINALYSIS, ROUTINE W REFLEX MICROSCOPIC
Bilirubin Urine: NEGATIVE
Glucose, UA: NEGATIVE mg/dL
Glucose, UA: NEGATIVE mg/dL
Hgb urine dipstick: NEGATIVE
Leukocytes, UA: NEGATIVE
Nitrite: NEGATIVE
Protein, ur: NEGATIVE mg/dL
Protein, ur: NEGATIVE mg/dL
Specific Gravity, Urine: 1.017 (ref 1.005–1.030)
Specific Gravity, Urine: 1.017 (ref 1.005–1.030)
Urobilinogen, UA: 0.2 mg/dL (ref 0.0–1.0)
Urobilinogen, UA: 0.2 mg/dL (ref 0.0–1.0)
Urobilinogen, UA: 1 mg/dL (ref 0.0–1.0)

## 2010-06-07 LAB — URINE CULTURE
Colony Count: NO GROWTH
Culture: NO GROWTH

## 2010-06-07 LAB — WET PREP, GENITAL: Yeast Wet Prep HPF POC: NONE SEEN

## 2010-06-07 LAB — URINE MICROSCOPIC-ADD ON

## 2010-06-07 LAB — PREGNANCY, URINE: Preg Test, Ur: NEGATIVE

## 2010-06-07 LAB — GC/CHLAMYDIA PROBE AMP, GENITAL
Chlamydia, DNA Probe: NEGATIVE
Chlamydia, DNA Probe: NEGATIVE
GC Probe Amp, Genital: NEGATIVE
GC Probe Amp, Genital: NEGATIVE

## 2010-06-12 LAB — COMPREHENSIVE METABOLIC PANEL
ALT: 13 U/L (ref 0–35)
AST: 20 U/L (ref 0–37)
Albumin: 3.7 g/dL (ref 3.5–5.2)
CO2: 26 mEq/L (ref 19–32)
CO2: 29 mEq/L (ref 19–32)
Calcium: 8.7 mg/dL (ref 8.4–10.5)
Calcium: 9.2 mg/dL (ref 8.4–10.5)
Creatinine, Ser: 0.8 mg/dL (ref 0.4–1.2)
GFR calc Af Amer: >60 mL/min (ref >60)
GFR calc non Af Amer: >60 mL/min (ref >60)
GFR calc non Af Amer: >60 mL/min (ref >60)
Glucose, Bld: 98 mg/dL (ref 70–99)
Sodium: 143 mEq/L (ref 135–145)
Total Protein: 6.9 g/dL (ref 6.0–8.3)
Total Protein: 7.5 g/dL (ref 6.0–8.3)

## 2010-06-12 LAB — URINALYSIS, ROUTINE W REFLEX MICROSCOPIC
Bilirubin Urine: NEGATIVE
Bilirubin Urine: NEGATIVE
Ketones, ur: NEGATIVE mg/dL
Nitrite: NEGATIVE
Nitrite: POSITIVE — AB
Protein, ur: NEGATIVE mg/dL
Specific Gravity, Urine: 1.02 (ref 1.005–1.030)
Specific Gravity, Urine: 1.021 (ref 1.005–1.030)
Urobilinogen, UA: 1 mg/dL (ref 0.0–1.0)
Urobilinogen, UA: 1 mg/dL (ref 0.0–1.0)
pH: 6.5 (ref 5.0–8.0)

## 2010-06-12 LAB — DIFFERENTIAL
Eosinophils Absolute: 0.1 10*3/uL (ref 0.0–0.7)
Eosinophils Relative: 2 % (ref 0–5)
Lymphocytes Relative: 22 % (ref 12–46)
Lymphs Abs: 1.9 10*3/uL (ref 0.7–4.0)
Lymphs Abs: 2.4 10*3/uL (ref 0.7–4.0)
Monocytes Absolute: 0.5 10*3/uL (ref 0.1–1.0)
Monocytes Relative: 8 % (ref 3–12)
Neutro Abs: 6 10*3/uL (ref 1.7–7.7)
Neutrophils Relative %: 70 % (ref 43–77)

## 2010-06-12 LAB — CBC
Hemoglobin: 13.3 g/dL (ref 12.0–15.0)
MCHC: 32.8 g/dL (ref 30.0–36.0)
MCHC: 34.1 g/dL (ref 30.0–36.0)
MCV: 89.4 fL (ref 78.0–100.0)
Platelets: 225 10*3/uL (ref 150–400)
RBC: 4.36 MIL/uL (ref 3.87–5.11)
RBC: 4.48 MIL/uL (ref 3.87–5.11)
RDW: 12.2 % (ref 11.5–15.5)
WBC: 6.1 10*3/uL (ref 4.0–10.5)

## 2010-06-12 LAB — LIPASE, BLOOD: Lipase: 68 U/L (ref 23–300)

## 2010-06-12 LAB — PREGNANCY, URINE
Preg Test, Ur: NEGATIVE
Preg Test, Ur: NEGATIVE

## 2010-06-12 LAB — URINE MICROSCOPIC-ADD ON

## 2010-06-20 ENCOUNTER — Emergency Department (HOSPITAL_BASED_OUTPATIENT_CLINIC_OR_DEPARTMENT_OTHER)
Admission: EM | Admit: 2010-06-20 | Discharge: 2010-06-20 | Disposition: A | Discharge status: Home / Self Care | Payer: Medicaid Other | Attending: Emergency Medicine | Admitting: Emergency Medicine

## 2010-06-20 DIAGNOSIS — G8929 Other chronic pain: Secondary | ICD-10-CM | POA: Insufficient documentation

## 2010-06-20 DIAGNOSIS — R109 Unspecified abdominal pain: Secondary | ICD-10-CM | POA: Insufficient documentation

## 2010-06-20 LAB — URINE MICROSCOPIC-ADD ON

## 2010-06-20 LAB — URINALYSIS, ROUTINE W REFLEX MICROSCOPIC
Bilirubin Urine: NEGATIVE
Glucose, UA: NEGATIVE mg/dL
Hgb urine dipstick: NEGATIVE
Specific Gravity, Urine: 1.006 (ref 1.005–1.030)
pH: 7 (ref 5.0–8.0)

## 2010-06-21 LAB — URINE CULTURE

## 2010-07-07 ENCOUNTER — Emergency Department (HOSPITAL_BASED_OUTPATIENT_CLINIC_OR_DEPARTMENT_OTHER)
Admission: EM | Admit: 2010-07-07 | Discharge: 2010-07-07 | Disposition: A | Discharge status: Home / Self Care | Payer: Medicaid Other | Attending: Emergency Medicine | Admitting: Emergency Medicine

## 2010-07-07 DIAGNOSIS — R5381 Other malaise: Secondary | ICD-10-CM | POA: Insufficient documentation

## 2010-07-07 DIAGNOSIS — R5383 Other fatigue: Secondary | ICD-10-CM | POA: Insufficient documentation

## 2010-07-07 DIAGNOSIS — G8929 Other chronic pain: Secondary | ICD-10-CM | POA: Insufficient documentation

## 2010-07-07 DIAGNOSIS — N390 Urinary tract infection, site not specified: Secondary | ICD-10-CM | POA: Insufficient documentation

## 2010-07-07 LAB — URINALYSIS, ROUTINE W REFLEX MICROSCOPIC
Glucose, UA: NEGATIVE mg/dL
Hgb urine dipstick: NEGATIVE
Protein, ur: NEGATIVE mg/dL
Specific Gravity, Urine: 1.026 (ref 1.005–1.030)
Urobilinogen, UA: 0.2 mg/dL (ref 0.0–1.0)

## 2010-07-07 LAB — BASIC METABOLIC PANEL
BUN: 8 mg/dL (ref 6–23)
Calcium: 10 mg/dL (ref 8.4–10.5)
Creatinine, Ser: 0.7 mg/dL (ref 0.4–1.2)
GFR calc non Af Amer: >60 mL/min (ref >60)
Glucose, Bld: 107 mg/dL — ABNORMAL HIGH (ref 70–99)

## 2010-07-07 LAB — URINE MICROSCOPIC-ADD ON

## 2010-07-07 LAB — CBC
HCT: 41 % (ref 36.0–46.0)
MCHC: 33.7 g/dL (ref 30.0–36.0)
MCV: 86.5 fL (ref 78.0–100.0)
Platelets: 237 10*3/uL (ref 150–400)
RDW: 12.6 % (ref 11.5–15.5)
WBC: 7.6 10*3/uL (ref 4.0–10.5)

## 2010-07-07 LAB — DIFFERENTIAL
Eosinophils Absolute: 0.1 10*3/uL (ref 0.0–0.7)
Eosinophils Relative: 1 % (ref 0–5)
Lymphocytes Relative: 34 % (ref 12–46)
Lymphs Abs: 2.6 10*3/uL (ref 0.7–4.0)
Monocytes Absolute: 0.5 10*3/uL (ref 0.1–1.0)

## 2010-07-09 LAB — URINE CULTURE: Colony Count: >=100000

## 2010-07-10 NOTE — Consult Note (Signed)
Cathy Rowland, Cathy Rowland                ACCOUNT NO.:  [id number]   MEDICAL RECORD NO.:  [id number]          PATIENT TYPE:  EMS   LOCATION:  ED                           FACILITY:  [facility]   PHYSICIAN:  Sigmund I. Patsi Sears, M.D.DATE OF BIRTH:  08-11-1970   DATE OF CONSULTATION:  DATE OF DISCHARGE:  05/10/2007                                 CONSULTATION   SUBJECTIVE:  Cathy Rowland is a 40 year old married white female from  [place], [place].  The patient saw Dr. Annabell Howells on Friday,  complaining of right flank pain.  She was felt to have urinary tract  infection, placed on Cipro 500 mg b.i.d., as well as Percocet for pain.  Her pain is not controlled, and she continues to have right flank pain,  with nausea.  No vomiting, no gross hematuria, no history of stones.   PAST MEDICAL HISTORY:  Noncontributory.   ALLERGIES:  None.   CURRENT MEDICATIONS:  Cipro and Percocet.   FAMILY HISTORY:  Noncontributory.  Alcohol is none.  Tobacco is  significant for snuff.  Sodas:  The patient drinks one 20 ounces Pepsi  per day.   PHYSICAL EXAMINATION:  GENERAL:  Today shows obese white female, in mild  distress., complaining of right flank pain.  VITAL SIGNS:  Heart rate 89, blood pressure 150/78, respiratory 18,  temperature 97.8.  NECK:  Supple, nontender.  CHEST:  Clear to P&A.  ABDOMEN:  Soft, positive bowel sounds without organomegaly or masses.  GENITOURINARY:  Shows normal female, external genitalia.  EXTREMITIES:  No cyanosis, no edema.  PSYCHOLOGICAL:  Normal orientation to time, person and place.   IMPRESSION:  Right flank pain with negative CT scan today.  The patient  will need to see her general medical physician for evaluation for  possible gallbladder disease.      Sigmund I. Patsi Sears, M.D.  Electronically Signed     SIT/MEDQ  D:  05/10/2007  T:  05/11/2007  Job:  [id number]   cc:   Excell Seltzer. Annabell Howells, M.D.  Fax: [phone]

## 2010-07-13 NOTE — Op Note (Signed)
NAMEESSIE, Cathy Rowland NO.:  [id number]   MEDICAL RECORD NO.:  [id number]          PATIENT TYPE:  AMB   LOCATION:  SDC                           FACILITY:  [facility]   PHYSICIAN:  Roseanna Rainbow, M.D.DATE OF BIRTH:  [birth year]   DATE OF PROCEDURE:  05/08/2006  DATE OF DISCHARGE:                               OPERATIVE REPORT   PREOPERATIVE DIAGNOSIS:  Abnormal uterine bleeding, rule out endometrial  polyp.   POSTOPERATIVE DIAGNOSIS:  Abnormal uterine bleeding, rule out  endometrial polyp.   PROCEDURE:  Dilatation and curettage, hysteroscopy, removal of  endometrial polyps.   SURGEON:  Roseanna Rainbow, M.D.   ANESTHESIA:  General endotracheal.   PATHOLOGY:  The endometrial curettings.   ESTIMATED BLOOD LOSS:  Minimal.   COMPLICATIONS:  None.   FINDINGS:  Upon hysteroscopic survey of the endometrial cavity.  The  tubal ostia were well visualized.  There was a polypoid lesion arising  from the uterine fundus that was left-sided arising from the sidewall.   DESCRIPTION OF PROCEDURE:  The patient is taken to the operating room  with an IV running.  She was given general anesthesia, placed in the  dorsal lithotomy position and prepped and draped in the usual sterile  fashion.  A weighted speculum and a Sims retractor was then placed into  the vagina.  The anterior lip of the cervix was grasped with a single-  tooth tenaculum.  The cervix was then dilated with [facility] dilators.  Hysteroscope was then advanced into the uterus and the above findings  noted.  The hysteroscope was removed.  A sharp curettage was then  performed.  The polyp forceps were used to grasp the base of the polyp.  The hysteroscope was then reintroduced into the uterus and it was felt  that the polyp had been removed in its entirety.  The hysteroscope was  then removed.  The single-tooth tenaculum was then removed with minimal  bleeding noted from the cervix.  At the close of the  procedure, the  instrument and pad counts were said to be correct x2.  The deficit from  the distending medium that was used, which was sorbitol, was 165 mL.  At  the close of the procedure, the instrument and pad counts were said to  be correct x2.  The patient was taken to the PACU awake and in stable  condition.      Roseanna Rainbow, M.D.  Electronically Signed     LAJ/MEDQ  D:  05/08/2006  T:  05/08/2006  Job:  [id number]

## 2010-07-13 NOTE — Op Note (Signed)
NAMEORLA, ESTRIN                 ACCOUNT NO.:  [id number]   MEDICAL RECORD NO.:  [id number]          PATIENT TYPE:  AMB   LOCATION:  DAY                          FACILITY:  [facility]   PHYSICIAN:  Wilmon Arms. Corliss Skains, M.D. DATE OF BIRTH:  [birth year]   DATE OF PROCEDURE:  01/11/2005  DATE OF DISCHARGE:                                 OPERATIVE REPORT   PREOPERATIVE DIAGNOSES:  Chronic calculous cholecystitis.   POSTOPERATIVE DIAGNOSES:  Chronic calculous cholecystitis.   PROCEDURE:  Laparoscopic cholecystectomy with intraoperative cholangiogram.   SURGEON:  Wilmon Arms. Corliss Skains, M.D.   ASSISTANT:  Anselm Pancoast. Zachery Dakins, M.D.   ANESTHESIA:  General endotracheal.   INDICATIONS FOR PROCEDURE:  The patient is a 40 year old female who  presented with several months of right upper quadrant abdominal pain  radiating into her back. This was exacerbated by food. Ultrasound showed  gallstones and a positive sonographic Murphy's sign but no sign of  cholecystitis. CT scan was otherwise negative. The patient was seen in  consultation and we recommended laparoscopic cholecystectomy.   DESCRIPTION OF PROCEDURE:  Just prior to surgery, the patient remembered to  tell us that she had a latex allergy. This was despite being questioned both  at the office and at her preop visit which she stated she had no allergies.  Therefore, latex free equipment was used. She was brought to the operating  room, placed in supine position on the operating table. After an adequate  level of general endotracheal anesthesia was obtained, the patient's abdomen  was prepped with Betadine, draped in a sterile fashion. A timeout was then  taken to ensure the proper position and proper procedure. Her umbilicus was  infiltrated with 0.25% Marcaine. A curvilinear incision was made just below  her umbilicus. Dissection was carried down through the subcutaneous fat to  the fascia. It was grasped with clamps and elevated and opened  in a vertical  fashion. The peritoneum was entered bluntly. A pursestring suture of #0  Vicryl was placed around the umbilical opening. The Hasson cannula was  inserted and pneumoperitoneum was obtained by insufflating CO2 and  maintaining a maximum pressure of 15 mmHg. The patient was rotated in the  reverse Trendelenburg position and rotated to her left. A 10 mm port was  placed in the subxiphoid position. Two 5 mm ports were placed in the right  upper quadrant. The gallbladder was visualized and was noted to be  uninflamed. This was grasped with a clamp and elevated over the edge of the  liver. The peritoneum around the hilum of the gallbladder was opened. The  cystic duct was circumferentially dissected. It was ligated with a clip  distally and a small opening was made on the cystic duct. A Cook  cholangiogram catheter was then inserted through a stab incision and  inserted into the cystic duct. It was secured with a clip. A cholangiogram  was then obtained which showed good flow of contrast proximally and distally  in the biliary tree. There was no evidence of obstruction. There was easy  flow into the duodenum. The cholangiogram  catheter was then removed. The  proximal cystic duct was ligated with 3 clips and then divided. The cystic  artery was ligated with clips and divided. Cautery was then used to dissect  the gallbladder away from the liver bed. The gallbladder was placed in an  EndoCatch sac. Cautery was then used to achieve hemostasis in the liver bed.  A small piece of Surgicel was laid in the liver bed. The irrigant was  suctioned out. The EndoCatch sac was removed through the umbilical port. We  did a final visual inspection of the liver bed and no bleeding was noted.  Pneumoperitoneum was released while removing all of the ports. The  pursestring suture was used to close the umbilical fascia. 4-0 Monocryl was  used to close the skin in a subcuticular fashion. Steri-Strips  and Opsite  were applied. The patient was extubated brought to the recovery room in  stable condition. All sponge, instrument and needle counts were correct.      Wilmon Arms. Tsuei, M.D.  Electronically Signed     MKT/MEDQ  D:  01/11/2005  T:  01/11/2005  Job:  [id number]   cc:   Delaney Meigs, M.D.  Fax: [phone]

## 2010-07-13 NOTE — Discharge Summary (Signed)
[facility]  Patient:    Cathy Rowland, Cathy Rowland                        MRN: [id number] Adm. Date:  [id number] Disc. Date: [id number] Attending:  Marcelle Overlie                           Discharge Summary  ADMISSION DIAGNOSES:          1. Intrauterine pregnancy at 29 weeks.                               2. Pyelonephritis.  DISCHARGE DIAGNOSES:          1. Intrauterine pregnancy at 29 weeks.                               2. Pyelonephritis.  HOSPITAL COURSE:              The patient is a 40 year old gravida 3, para 2 at 70 weeks who presents with a complaint of right flank pain, fever, nausea, and dysuria.  On admission patient was noted to have a UA which was remarkable for greater than 80 ketones, nitrite positive, leukocyte esterase small amount,6-10 white blood cells, and many bacteria.  The urine was then sent for culture which subsequently grew out greater than 100,000 E. coli.  On admission patients white blood cell count was 11.9 with 84% neutrophils and hemoglobin was 10.6.  Routine chemistry was performed which was unremarkable with a creatinine of 0.6.  Patient was admitted and was started on IV antibiotics and by hospital day #3 had remained afebrile.  Her right CVA tenderness had decreased.  She was sent home with Macrobid to take one p.o. b.i.d. for 12 days and then Macrodantin suppression.  She was advised to follow up for fever, return of flank pain, or nausea.  She will follow up in our office in one week.  She was also given a prescription for Tylenol No. 3 to take as needed for pain.  CONDITION ON DISCHARGE:       Good. DD:  12/25/99 TD:  12/25/99 Job: [id number]

## 2010-07-13 NOTE — Op Note (Signed)
NAME:  Cathy Rowland, Cathy Rowland                           ACCOUNT NO.:  [id number]   MEDICAL RECORD NO.:  [id number]                   PATIENT TYPE:  AMB   LOCATION:  DAY                                  FACILITY:  [facility]   PHYSICIAN:  R. Roetta Sessions, M.D.              DATE OF BIRTH:  Jul 18, 1970   DATE OF PROCEDURE:  08/23/2003  DATE OF DISCHARGE:                                 OPERATIVE REPORT   PROCEDURE:  Diagnostic EGD.   INDICATIONS FOR PROCEDURE:  The patient is a 40 year old lady with  epigastric pain.  She takes Goody's powders to excess.  Abnormal upper GI  series demonstrated abnormalities in the area of the antrum and duodenal  bulb with spasm and poor emptying.  EGD is now being done.  This approach  has been discussed with the patient at length.  The potential risks,  benefits, and alternatives have been reviewed and questions answered.  Please see the dictated H&P.  She did have positive H pylori serologies  through Dr. Joyce Copa office but has not been treated.   PROCEDURE:  O2 saturation, blood pressure, pulses, and respirations were  monitored throughout the entire procedure.  Conscious sedation was with IV  Versed and Demerol in incremental doses.  The instrument used was the  Olympus video chip system.   FINDINGS:  Examination of the tubular esophagus revealed distal esophageal  erosions within 1 cm of the EG junction.  The esophageal mucosa otherwise  appeared normal.  The EG junction was easily traversed.   Stomach:  The gastric cavity was empty and insufflated well with air.  Thorough examination of the gastric mucosa including retroflex view of the  proximal stomach and esophagogastric junction demonstrated multiple antral  erosions.  There was no infiltrating process or frank ulcer.  The pylorus  was patent and easily traversed.   Duodenum:  Examination of the bulb and second portion revealed additional  erosions in the bulb.  No frank ulcer.  D1 and D2 otherwise  appeared normal.   THERAPEUTIC/DIAGNOSTIC MANEUVERS PERFORMED:  None.   The patient tolerated the procedure well and was reactive in endoscopy.   IMPRESSION:  1. Distal esophageal erosions consistent with erosive reflux esophagitis,     mild.  Otherwise normal esophagus.  2. Antral and bulbar erosions, as described above.  Otherwise normal     stomach.  3. Normal first and second portions of the duodenum.  4. The patient has evidence of Helicobacter pylori based on serologies     previously, although the majority of today's morphological changes could     well be on the basis of aspirin powder use.   RECOMMENDATIONS:  1. Refrain from taking BC and all aspirin products for now.  2. Prevpac x14 days for H pylori followed by Prevacid 30 mg orally daily.  3. Followup appointment with [place] in six to eight weeks.  ___________________________________________                                            Jonathon Bellows, M.D.   RMR/MEDQ  D:  08/23/2003  T:  08/23/2003  Job:  [id number]   cc:   Delaney Meigs, M.D.  [address]  [place]  [place] [address]  Fax: [phone]

## 2010-07-13 NOTE — Discharge Summary (Signed)
[facility]  Patient:    Cathy Rowland, Cathy Rowland                        MRN: [id number] Adm. Date:  [id number] Disc. Date: [id number] Attending:  Lars Pinks Dictator:   Leilani Able, P.A.                           Discharge Summary  FINAL DIAGNOSIS:              Intrauterine pregnancy at 18 and three-seventh                               weeks gestation, and right pyelonephritis.  HOSPITAL COURSE:              This 40 year old g3, p2, was admitted at 17 and three-seventh weeks gestation with 48 hours of worsening right flank pain. The patient has had a history of pyelonephritis which Dr. Annabell Howells has been following.  The patient was seen the day before with positive leukocytes and 10--30 ______  in her urine, was started on Keflex 500 mg one q.i.d.  She presents here in triage feeling worse today.  The patient had a temperature of 101 and had a white blood cell count of 15.2.  The patient was started on Cefotan 2 grams IV every 12 hours and was admitted to the unit.  The patient was still having aches, nausea, and vomiting, but started to feel better by hospital day #2.  The patient was felt ready for discharge on hospital day #3. She was on a regular diet and tolerating it well and ambulating.  She was sent home on a regular diet, told to decrease activities, told to continue prenatal vitamins, and was given Keflex 500 mg 1 q.i.d. x three days and follow-up in the office later that day for an OB ultrasound.  LABORATORIES UPON DISCHARGE:  The patients hemoglobin was 10.5.  Her white blood cell count had dropped to 6.9. DD:  09/24/99 TD:  09/25/99 Job: [id number]

## 2010-08-28 ENCOUNTER — Other Ambulatory Visit (HOSPITAL_COMMUNITY): Payer: Self-pay | Admitting: Sports Medicine

## 2010-08-28 DIAGNOSIS — M545 Low back pain, unspecified: Secondary | ICD-10-CM

## 2010-08-30 ENCOUNTER — Emergency Department (HOSPITAL_BASED_OUTPATIENT_CLINIC_OR_DEPARTMENT_OTHER)
Admission: EM | Admit: 2010-08-30 | Discharge: 2010-08-30 | Disposition: A | Discharge status: Home / Self Care | Payer: Medicaid Other | Attending: Emergency Medicine | Admitting: Emergency Medicine

## 2010-08-30 DIAGNOSIS — G8929 Other chronic pain: Secondary | ICD-10-CM | POA: Insufficient documentation

## 2010-08-30 DIAGNOSIS — R300 Dysuria: Secondary | ICD-10-CM | POA: Insufficient documentation

## 2010-08-30 LAB — URINALYSIS, ROUTINE W REFLEX MICROSCOPIC
Glucose, UA: NEGATIVE mg/dL
Ketones, ur: NEGATIVE mg/dL
Leukocytes, UA: NEGATIVE
Nitrite: NEGATIVE
Specific Gravity, Urine: 1.029 (ref 1.005–1.030)
pH: 5.5 (ref 5.0–8.0)

## 2010-09-03 ENCOUNTER — Inpatient Hospital Stay (HOSPITAL_COMMUNITY): Admission: RE | Admit: 2010-09-03 | Payer: Medicaid Other | Source: Ambulatory Visit

## 2010-09-05 ENCOUNTER — Ambulatory Visit (HOSPITAL_COMMUNITY)
Admission: RE | Admit: 2010-09-05 | Discharge: 2010-09-05 | Disposition: A | Discharge status: Home / Self Care | Payer: Medicaid Other | Source: Ambulatory Visit | Attending: Sports Medicine | Admitting: Sports Medicine

## 2010-09-05 DIAGNOSIS — M545 Low back pain, unspecified: Secondary | ICD-10-CM

## 2010-09-10 ENCOUNTER — Other Ambulatory Visit (HOSPITAL_COMMUNITY): Payer: Self-pay | Admitting: Sports Medicine

## 2010-09-10 DIAGNOSIS — R52 Pain, unspecified: Secondary | ICD-10-CM

## 2010-09-14 ENCOUNTER — Inpatient Hospital Stay (HOSPITAL_COMMUNITY): Admission: RE | Admit: 2010-09-14 | Payer: Medicaid Other | Source: Ambulatory Visit

## 2010-09-14 ENCOUNTER — Ambulatory Visit (HOSPITAL_COMMUNITY)
Admission: RE | Admit: 2010-09-14 | Discharge: 2010-09-14 | Disposition: A | Discharge status: Home / Self Care | Payer: Medicaid Other | Source: Ambulatory Visit | Attending: Sports Medicine | Admitting: Sports Medicine

## 2010-09-14 ENCOUNTER — Other Ambulatory Visit (HOSPITAL_COMMUNITY): Payer: Medicaid Other

## 2010-09-14 DIAGNOSIS — M51379 Other intervertebral disc degeneration, lumbosacral region without mention of lumbar back pain or lower extremity pain: Secondary | ICD-10-CM | POA: Insufficient documentation

## 2010-09-14 DIAGNOSIS — M545 Low back pain, unspecified: Secondary | ICD-10-CM | POA: Insufficient documentation

## 2010-09-14 DIAGNOSIS — R52 Pain, unspecified: Secondary | ICD-10-CM

## 2010-09-14 DIAGNOSIS — M79609 Pain in unspecified limb: Secondary | ICD-10-CM | POA: Insufficient documentation

## 2010-09-14 DIAGNOSIS — M5137 Other intervertebral disc degeneration, lumbosacral region: Secondary | ICD-10-CM | POA: Insufficient documentation

## 2010-09-26 ENCOUNTER — Ambulatory Visit: Payer: Self-pay | Admitting: Physical Therapy

## 2010-10-09 ENCOUNTER — Ambulatory Visit: Payer: Medicaid Other | Attending: Sports Medicine | Admitting: Physical Therapy

## 2010-10-09 DIAGNOSIS — R5381 Other malaise: Secondary | ICD-10-CM | POA: Insufficient documentation

## 2010-10-09 DIAGNOSIS — M545 Low back pain, unspecified: Secondary | ICD-10-CM | POA: Insufficient documentation

## 2010-10-09 DIAGNOSIS — IMO0001 Reserved for inherently not codable concepts without codable children: Secondary | ICD-10-CM | POA: Insufficient documentation

## 2010-10-11 ENCOUNTER — Emergency Department (HOSPITAL_BASED_OUTPATIENT_CLINIC_OR_DEPARTMENT_OTHER)
Admission: EM | Admit: 2010-10-11 | Discharge: 2010-10-11 | Disposition: A | Discharge status: Home / Self Care | Payer: Medicaid Other | Attending: Emergency Medicine | Admitting: Emergency Medicine

## 2010-10-11 ENCOUNTER — Encounter: Payer: Self-pay | Admitting: *Deleted

## 2010-10-11 DIAGNOSIS — R1909 Other intra-abdominal and pelvic swelling, mass and lump: Secondary | ICD-10-CM | POA: Insufficient documentation

## 2010-10-11 DIAGNOSIS — G43909 Migraine, unspecified, not intractable, without status migrainosus: Secondary | ICD-10-CM | POA: Insufficient documentation

## 2010-10-11 DIAGNOSIS — R209 Unspecified disturbances of skin sensation: Secondary | ICD-10-CM | POA: Insufficient documentation

## 2010-10-11 MED ORDER — DIPHENHYDRAMINE HCL 50 MG/ML IJ SOLN
25.0000 mg | Freq: Once | INTRAMUSCULAR | Status: AC
  Administered 2010-10-11: 50 mg via INTRAMUSCULAR
  Filled 2010-10-11: qty 1

## 2010-10-11 MED ORDER — METOCLOPRAMIDE HCL 5 MG/ML IJ SOLN
10.0000 mg | Freq: Once | INTRAMUSCULAR | Status: AC
  Administered 2010-10-11: 10 mg via INTRAMUSCULAR
  Filled 2010-10-11: qty 2

## 2010-10-11 MED ORDER — MORPHINE SULFATE 4 MG/ML IJ SOLN
4.0000 mg | Freq: Once | INTRAMUSCULAR | Status: AC
  Administered 2010-10-11: 4 mg via INTRAMUSCULAR
  Filled 2010-10-11: qty 1

## 2010-10-11 NOTE — Discharge Instructions (Signed)
Follow up with your doctor tomorrow--return here for any problems

## 2010-10-11 NOTE — ED Notes (Signed)
Pt presents to ED tonight with vaginal swelling taht she noticed today.  Pt noticied small lump last night.  Pt took no OTC meds pta

## 2010-10-11 NOTE — ED Notes (Signed)
Pt. Has had medication and is ready for discharge.  No distress noted in Pt.

## 2010-10-11 NOTE — ED Provider Notes (Signed)
History     CSN: [id number] Arrival date & time: 10/11/2010  8:15 PM  Chief Complaint  Patient presents with  . Groin Swelling   The history is provided by the patient and the spouse.   Pt here with left labial swelling for a week--seen by her gyn 3 days ago for urinary bladder problems, urine cx came back yesterday and showed infection, pt place on bactrim--pt denies fever, back pain, vag bleeding or discharge--sx worse with movement--also notes typical migraine sx--denies neck pain, photophobia, or emesis History reviewed. No pertinent past medical history.  Past Surgical History  Procedure Date  . Cholecystectomy     No family history on file.  History  Substance Use Topics  . Smoking status: Never Smoker   . Smokeless tobacco: Current User  . Alcohol Use: No    OB History    Grav Para Term Preterm Abortions TAB SAB Ect Mult Living                  Review of Systems  All other systems reviewed and are negative.    Physical Exam  BP 123/76  Pulse 76  Temp(Src) 98.3 F (36.8 C) (Oral)  Resp 18  SpO2 100%  LMP 09/21/2010  Physical Exam  Nursing note and vitals reviewed. Constitutional: She is oriented to person, place, and time. She appears well-developed and well-nourished.  Non-toxic appearance.  HENT:  Head: Normocephalic and atraumatic.  Eyes: Conjunctivae are normal. Pupils are equal, round, and reactive to light.  Neck: Normal range of motion.  Cardiovascular: Normal rate.   Pulmonary/Chest: Effort normal.  Genitourinary:    There is no tenderness on the right labia. There is tenderness on the left labia. There is no lesion on the left labia. No erythema or bleeding around the vagina. No vaginal discharge found.  Neurological: She is alert and oriented to person, place, and time.  Skin: Skin is warm and dry.  Psychiatric: She has a normal mood and affect.    ED Course  Procedures  MDM   Pt currently on meds for uti, no evidence of abscess,  will tx migraine, pt to seen her gyn tomorrow      Toy Baker, MD 10/11/10 2100

## 2010-10-11 NOTE — ED Notes (Signed)
Upon exam with Dr. Freida Busman the Pt. Does not have any edema noted in the vaginal area.  Pt. Reports having a Migrane headache also.

## 2010-10-16 ENCOUNTER — Ambulatory Visit: Payer: Medicaid Other | Admitting: Physical Therapy

## 2010-10-23 ENCOUNTER — Ambulatory Visit: Payer: Medicaid Other | Admitting: Physical Therapy

## 2010-10-30 ENCOUNTER — Encounter: Payer: Self-pay | Admitting: Physical Therapy

## 2010-10-31 ENCOUNTER — Emergency Department (HOSPITAL_BASED_OUTPATIENT_CLINIC_OR_DEPARTMENT_OTHER)
Admission: EM | Admit: 2010-10-31 | Discharge: 2010-10-31 | Disposition: A | Discharge status: Home / Self Care | Payer: Medicaid Other | Attending: Emergency Medicine | Admitting: Emergency Medicine

## 2010-10-31 ENCOUNTER — Encounter (HOSPITAL_BASED_OUTPATIENT_CLINIC_OR_DEPARTMENT_OTHER): Payer: Self-pay | Admitting: Emergency Medicine

## 2010-10-31 DIAGNOSIS — N390 Urinary tract infection, site not specified: Secondary | ICD-10-CM | POA: Insufficient documentation

## 2010-10-31 DIAGNOSIS — R300 Dysuria: Secondary | ICD-10-CM | POA: Insufficient documentation

## 2010-10-31 DIAGNOSIS — M549 Dorsalgia, unspecified: Secondary | ICD-10-CM | POA: Insufficient documentation

## 2010-10-31 LAB — URINALYSIS, ROUTINE W REFLEX MICROSCOPIC
Bilirubin Urine: NEGATIVE
Nitrite: POSITIVE — AB
Specific Gravity, Urine: 1.013 (ref 1.005–1.030)
Urobilinogen, UA: 0.2 mg/dL (ref 0.0–1.0)

## 2010-10-31 LAB — URINE MICROSCOPIC-ADD ON

## 2010-10-31 MED ORDER — HYDROCODONE-ACETAMINOPHEN 5-325 MG PO TABS: 2.0000 | ORAL_TABLET | ORAL | Status: AC | PRN

## 2010-10-31 MED ORDER — CEPHALEXIN 500 MG PO CAPS: 500.0000 mg | ORAL_CAPSULE | Freq: Four times a day (QID) | ORAL | Status: AC

## 2010-10-31 MED ORDER — SULFAMETHOXAZOLE-TRIMETHOPRIM 800-160 MG PO TABS: 1.0000 | ORAL_TABLET | Freq: Two times a day (BID) | ORAL | Status: AC

## 2010-10-31 NOTE — ED Notes (Signed)
Pt c/o LT side/back pain since yest, but reports similar sx x "weeks"

## 2010-10-31 NOTE — ED Provider Notes (Signed)
History     CSN: [id number] Arrival date & time: 10/31/2010 11:59 AM  Chief Complaint  Patient presents with  . Back Pain   Patient is a 40 y.o. female presenting with dysuria.  Dysuria  This is a new problem. The current episode started yesterday. The problem occurs every urination. The problem has been gradually worsening. The quality of the pain is described as burning, stabbing, shooting and aching. The pain is at a severity of 9/10. The pain is severe. There has been no fever. She is sexually active. Pertinent negatives include no chills, no sweats, no nausea, no vomiting, no discharge, no frequency and no hematuria. She has tried nothing for the symptoms. Her past medical history is significant for urological procedure and recurrent UTIs. Her past medical history does not include kidney stones or single kidney.  Pt complains of burning with urination  History reviewed. No pertinent past medical history.  Past Surgical History  Procedure Date  . Cholecystectomy     No family history on file.  History  Substance Use Topics  . Smoking status: Never Smoker   . Smokeless tobacco: Current User  . Alcohol Use: No    OB History    Grav Para Term Preterm Abortions TAB SAB Ect Mult Living                  Review of Systems  Constitutional: Negative for chills.  Gastrointestinal: Negative for nausea and vomiting.  Genitourinary: Positive for dysuria. Negative for frequency and hematuria.  All other systems reviewed and are negative.    Physical Exam  BP 140/83  Pulse 102  Temp(Src) 97.7 F (36.5 C) (Oral)  Resp 16  SpO2 99%  LMP 10/24/2010  Physical Exam  Nursing note and vitals reviewed. Constitutional: She is oriented to person, place, and time. She appears well-developed and well-nourished.  HENT:  Head: Normocephalic and atraumatic.  Right Ear: External ear normal.  Left Ear: External ear normal.  Mouth/Throat: Oropharynx is clear and moist.  Eyes:  Conjunctivae and EOM are normal. Pupils are equal, round, and reactive to light.  Neck: Normal range of motion. Neck supple.  Cardiovascular: Normal rate.   Pulmonary/Chest: Effort normal.  Abdominal: Soft.  Musculoskeletal: Normal range of motion.  Neurological: She is alert and oriented to person, place, and time.  Skin: Skin is warm and dry.  Psychiatric: She has a normal mood and affect.    ED Course  Procedures  MDM Pt urine shows wbc's tntc and many bacteria      Langston Masker, [place] 10/31/10 1307

## 2010-11-12 NOTE — ED Provider Notes (Signed)
Evaluation and management procedures were performed by the PA/NP under my supervision/collaboration.    Felisa Bonier, MD 11/12/10 [phone]

## 2010-11-13 ENCOUNTER — Ambulatory Visit (HOSPITAL_BASED_OUTPATIENT_CLINIC_OR_DEPARTMENT_OTHER)
Admission: RE | Admit: 2010-11-13 | Discharge: 2010-11-13 | Disposition: A | Discharge status: Home / Self Care | Payer: Medicaid Other | Source: Ambulatory Visit | Attending: Urology | Admitting: Urology

## 2010-11-13 DIAGNOSIS — Z79899 Other long term (current) drug therapy: Secondary | ICD-10-CM | POA: Insufficient documentation

## 2010-11-13 DIAGNOSIS — N949 Unspecified condition associated with female genital organs and menstrual cycle: Secondary | ICD-10-CM | POA: Insufficient documentation

## 2010-11-13 DIAGNOSIS — N3080 Other cystitis without hematuria: Secondary | ICD-10-CM | POA: Insufficient documentation

## 2010-11-13 LAB — POCT HEMOGLOBIN-HEMACUE: Hemoglobin: 13.9 g/dL (ref 12.0–15.0)

## 2010-11-13 LAB — POCT PREGNANCY, URINE: Preg Test, Ur: NEGATIVE

## 2010-11-16 LAB — POCT URINALYSIS DIP (DEVICE)
Protein, ur: NEGATIVE
Specific Gravity, Urine: 1.01
Urobilinogen, UA: 0.2
pH: 6.5

## 2010-11-16 LAB — POCT PREGNANCY, URINE: Preg Test, Ur: NEGATIVE

## 2010-11-19 LAB — URINALYSIS, ROUTINE W REFLEX MICROSCOPIC
Bilirubin Urine: NEGATIVE
Glucose, UA: NEGATIVE
Hgb urine dipstick: NEGATIVE
Ketones, ur: NEGATIVE
Protein, ur: NEGATIVE

## 2010-11-20 NOTE — Op Note (Signed)
  NAMETRISTA, CIOCCA                ACCOUNT NO.:  [id number]  MEDICAL RECORD NO.:  [id number]  LOCATION:                               FACILITY:  [facility]  PHYSICIAN:  Martina Sinner, MD DATE OF BIRTH:  05/26/1970  DATE OF PROCEDURE: DATE OF DISCHARGE:                              OPERATIVE REPORT   PREOPERATIVE DIAGNOSIS:  Pelvic pain.  POSTOPERATIVE DIAGNOSIS: 1. Pelvic pain. 2. Cystitis cystica.  SURGERY:  Cystoscopy, bladder hydrodistention, bladder instillation therapy.  Ms. Stenberg has pelvic pain, mixed stress urge incontinence, mild-to- moderate cystocele and small rectocele.  She consented to the above procedure.  22-French scope was utilized.  Leg position was good. Antibiotics were given.  Her bladder was hydrodistended to approximately 500 mL.  There is no stitch, foreign body, or carcinoma.  Trigone was normal.  The bladder was emptied.  I reexamined the bladder and there was no findings of interstitial cystitis.  There was mild findings of cystitis cystica.  The bladder was emptied.  I repeated the procedure and with no changes.  I emptied the bladder.  A separate procedure.  I instilled 15 mL of 0.5% Marcaine plus 400 mg of Pyridium.  I will use my usual protocol try to sort out her pain postprocedure.  I am concerned that she has a lot of pain and this  would complicate any pelvic prolapse surgery and/or sling.          ______________________________ Martina Sinner, MD     SAM/MEDQ  D:  11/13/2010  T:  11/13/2010  Job:  [id number]  Electronically Signed by Alfredo Martinez MD on 11/20/2010 09:49:10 AM

## 2010-11-21 LAB — POCT URINALYSIS DIP (DEVICE)
Bilirubin Urine: NEGATIVE
Glucose, UA: NEGATIVE
Ketones, ur: NEGATIVE
Nitrite: NEGATIVE

## 2010-11-21 LAB — POCT PREGNANCY, URINE: Preg Test, Ur: NEGATIVE

## 2010-11-26 LAB — URINALYSIS, ROUTINE W REFLEX MICROSCOPIC
Glucose, UA: NEGATIVE
Protein, ur: NEGATIVE
Urobilinogen, UA: 0.2

## 2010-11-26 LAB — URINE MICROSCOPIC-ADD ON

## 2010-11-27 LAB — GLUCOSE, CAPILLARY: Glucose-Capillary: 99

## 2010-11-27 LAB — URINALYSIS, ROUTINE W REFLEX MICROSCOPIC
Bilirubin Urine: NEGATIVE
Glucose, UA: NEGATIVE
Hgb urine dipstick: NEGATIVE
Ketones, ur: NEGATIVE
Nitrite: POSITIVE — AB
Protein, ur: NEGATIVE
Protein, ur: NEGATIVE
Specific Gravity, Urine: 1.018
Urobilinogen, UA: 1

## 2010-11-27 LAB — BASIC METABOLIC PANEL
BUN: 9
Calcium: 9.3
GFR calc non Af Amer: >60
Glucose, Bld: 109 — ABNORMAL HIGH

## 2010-11-27 LAB — DIFFERENTIAL
Basophils Absolute: 0.1
Basophils Relative: 1
Eosinophils Relative: 1
Lymphocytes Relative: 24
Neutro Abs: 5.7

## 2010-11-27 LAB — RAPID STREP SCREEN (MED CTR MEBANE ONLY): Streptococcus, Group A Screen (Direct): NEGATIVE

## 2010-11-27 LAB — URINE MICROSCOPIC-ADD ON

## 2010-11-27 LAB — CBC
HCT: 40.6
Platelets: 227
RDW: 12.4

## 2010-11-27 LAB — PREGNANCY, URINE: Preg Test, Ur: NEGATIVE

## 2010-12-01 ENCOUNTER — Encounter (HOSPITAL_BASED_OUTPATIENT_CLINIC_OR_DEPARTMENT_OTHER): Payer: Self-pay | Admitting: *Deleted

## 2010-12-01 ENCOUNTER — Emergency Department (HOSPITAL_BASED_OUTPATIENT_CLINIC_OR_DEPARTMENT_OTHER)
Admission: EM | Admit: 2010-12-01 | Discharge: 2010-12-01 | Disposition: A | Discharge status: Home / Self Care | Payer: Medicaid Other | Attending: Emergency Medicine | Admitting: Emergency Medicine

## 2010-12-01 DIAGNOSIS — G43909 Migraine, unspecified, not intractable, without status migrainosus: Secondary | ICD-10-CM | POA: Insufficient documentation

## 2010-12-01 DIAGNOSIS — R110 Nausea: Secondary | ICD-10-CM | POA: Insufficient documentation

## 2010-12-01 DIAGNOSIS — R109 Unspecified abdominal pain: Secondary | ICD-10-CM | POA: Insufficient documentation

## 2010-12-01 HISTORY — DX: Cystitis, unspecified without hematuria: N30.90

## 2010-12-01 HISTORY — DX: Reserved for concepts with insufficient information to code with codable children: IMO0002

## 2010-12-01 HISTORY — DX: Unspecified osteoarthritis, unspecified site: M19.90

## 2010-12-01 LAB — URINALYSIS, ROUTINE W REFLEX MICROSCOPIC
Hgb urine dipstick: NEGATIVE
Nitrite: NEGATIVE
Specific Gravity, Urine: 1.022 (ref 1.005–1.030)
Urobilinogen, UA: 0.2 mg/dL (ref 0.0–1.0)

## 2010-12-01 LAB — URINE MICROSCOPIC-ADD ON

## 2010-12-01 MED ORDER — METOCLOPRAMIDE HCL 5 MG/ML IJ SOLN
10.0000 mg | Freq: Once | INTRAMUSCULAR | Status: AC
  Administered 2010-12-01: 10 mg via INTRAMUSCULAR

## 2010-12-01 MED ORDER — METOCLOPRAMIDE HCL 5 MG/ML IJ SOLN
10.0000 mg | Freq: Once | INTRAMUSCULAR | Status: DC
  Filled 2010-12-01: qty 2

## 2010-12-01 MED ORDER — DEXAMETHASONE SODIUM PHOSPHATE 10 MG/ML IJ SOLN
10.0000 mg | Freq: Once | INTRAMUSCULAR | Status: DC
  Filled 2010-12-01: qty 1

## 2010-12-01 MED ORDER — TRAMADOL HCL 50 MG PO TABS: 50.0000 mg | ORAL_TABLET | Freq: Four times a day (QID) | ORAL | Status: AC | PRN

## 2010-12-01 MED ORDER — DEXAMETHASONE SODIUM PHOSPHATE 10 MG/ML IJ SOLN
10.0000 mg | Freq: Once | INTRAMUSCULAR | Status: AC
  Administered 2010-12-01: 10 mg via INTRAMUSCULAR

## 2010-12-01 NOTE — ED Notes (Signed)
Headache x 3 days- also c/o lower abd pain and back pain- denies vag d/c

## 2010-12-01 NOTE — ED Provider Notes (Signed)
History     CSN: [id number] Arrival date & time: 12/01/2010  9:02 PM  Chief Complaint  Patient presents with  . Headache  . Abdominal Pain    (Consider location/radiation/quality/duration/timing/severity/associated sxs/prior treatment) HPI Pt has had a constant migraine headache for the past 2-3 days.  Located bilateral frontal region, describes as throbbing and associated w/ photophobia and nausea.  Relieved by po benadryl but then returns.  Symptoms are typical.  No recent head trauma.  Denies fever.  Also c/o bladder infection.  Has been experiencing increased urinary frequency, mid-line lower abd pain and diffuse low back pain.  Has had several UTIs in the past that presented similarly.  Denies vaginal symptoms.  Denies trauma/heavy lifting.   Past Medical History  Diagnosis Date  . Arthritis   . Bulging discs   . Bladder infection     Past Surgical History  Procedure Date  . Cholecystectomy   . Bladder surgery   . Cervical polypectomy     No family history on file.  History  Substance Use Topics  . Smoking status: Never Smoker   . Smokeless tobacco: Current User    Types: Snuff  . Alcohol Use: No    OB History    Grav Para Term Preterm Abortions TAB SAB Ect Mult Living                  Review of Systems  All other systems reviewed and are negative.    Allergies  Aspirin and Hydrocodone  Home Medications   Current Outpatient Rx  Name Route Sig Dispense Refill  . DIPHENHYDRAMINE HCL 25 MG PO TABS Oral Take 25 mg by mouth once as needed. For migraine       BP 108/68  Pulse 88  Temp(Src) 98.7 F (37.1 C) (Oral)  Resp 20  Ht 5\' 3"  (1.6 m)  Wt 226 lb (102.513 kg)  BMI 40.03 kg/m2  SpO2 100%  LMP 11/15/2010  Physical Exam  Nursing note and vitals reviewed. Constitutional: She is oriented to person, place, and time. She appears well-developed and well-nourished. No distress.  HENT:  Head: Normocephalic.  Eyes:       Normal appearance  Neck:  Normal range of motion.       No meningeal signs  Cardiovascular: Normal rate and regular rhythm.   Pulmonary/Chest: Effort normal and breath sounds normal.  Abdominal: Soft. Bowel sounds are normal. She exhibits no distension and no mass. There is no rebound and no guarding.       Mild, mid-line lower abd ttp only  Neurological: She is alert and oriented to person, place, and time. No cranial nerve deficit or sensory deficit. Coordination normal.       5/5 and equal upper and lower extremity strength.  No past pointing.    Skin: Skin is warm and dry. No rash noted.  Psychiatric: She has a normal mood and affect. Her behavior is normal.    ED Course  Procedures (including critical care time)  Labs Reviewed  URINALYSIS, ROUTINE W REFLEX MICROSCOPIC - Abnormal; Notable for the following:    Leukocytes, UA TRACE (*)    All other components within normal limits  URINE MICROSCOPIC-ADD ON - Abnormal; Notable for the following:    Squamous Epithelial / LPF FEW (*)    Bacteria, UA FEW (*)    All other components within normal limits  PREGNANCY, URINE  URINE CULTURE   No results found.   1. Migraine  MDM  Pt c/o typical migraine headache.  No fever, focal neuro deficits of meningeal signs on exam.  Received IM reglan and decadron for pain w/ relief.  Has migraines frequently.  Recommended f/u with PCP to discuss possibility of starting preventative medication.  Also c/o bladder infection.  Has had frequency, mild lower abd pain and diffuse low back pain.  No other GU symptoms. Urine shows few bacteria and trace leuk esterase.  Sent for culture.  Return precautions discussed.         Otilio Miu, [place] 12/02/10 [phone]

## 2010-12-02 NOTE — ED Provider Notes (Signed)
Medical screening examination/treatment/procedure(s) were performed by non-physician practitioner and as supervising physician I was immediately available for consultation/collaboration.  Geoffery Lyons, MD 12/02/10 1520

## 2010-12-03 LAB — URINE CULTURE

## 2010-12-04 LAB — POCT URINALYSIS DIP (DEVICE)
Bilirubin Urine: NEGATIVE
Glucose, UA: NEGATIVE
Nitrite: NEGATIVE

## 2010-12-05 LAB — POCT URINALYSIS DIP (DEVICE)
Bilirubin Urine: NEGATIVE
Glucose, UA: NEGATIVE
Nitrite: NEGATIVE
Operator id: 239701

## 2010-12-05 LAB — POCT PREGNANCY, URINE: Operator id: 247071

## 2010-12-05 LAB — URINE CULTURE

## 2010-12-06 LAB — POCT PREGNANCY, URINE
Operator id: 247071
Preg Test, Ur: NEGATIVE

## 2010-12-06 LAB — POCT URINALYSIS DIP (DEVICE)
Bilirubin Urine: NEGATIVE
Ketones, ur: NEGATIVE
pH: 6

## 2010-12-10 LAB — POCT URINALYSIS DIP (DEVICE)
Glucose, UA: NEGATIVE
Ketones, ur: NEGATIVE
Operator id: 235561
Specific Gravity, Urine: 1.015
Urobilinogen, UA: 0.2

## 2010-12-19 ENCOUNTER — Emergency Department (HOSPITAL_BASED_OUTPATIENT_CLINIC_OR_DEPARTMENT_OTHER)
Admission: EM | Admit: 2010-12-19 | Discharge: 2010-12-19 | Disposition: A | Discharge status: Home / Self Care | Payer: Medicaid Other | Attending: Emergency Medicine | Admitting: Emergency Medicine

## 2010-12-19 ENCOUNTER — Encounter (HOSPITAL_BASED_OUTPATIENT_CLINIC_OR_DEPARTMENT_OTHER): Payer: Self-pay

## 2010-12-19 DIAGNOSIS — N390 Urinary tract infection, site not specified: Secondary | ICD-10-CM

## 2010-12-19 DIAGNOSIS — R109 Unspecified abdominal pain: Secondary | ICD-10-CM | POA: Insufficient documentation

## 2010-12-19 LAB — URINALYSIS, ROUTINE W REFLEX MICROSCOPIC
Glucose, UA: NEGATIVE mg/dL
Leukocytes, UA: NEGATIVE
Nitrite: NEGATIVE
Protein, ur: NEGATIVE mg/dL

## 2010-12-19 LAB — URINE MICROSCOPIC-ADD ON

## 2010-12-19 LAB — PREGNANCY, URINE: Preg Test, Ur: NEGATIVE

## 2010-12-19 MED ORDER — KETOROLAC TROMETHAMINE 30 MG/ML IJ SOLN
30.0000 mg | Freq: Once | INTRAMUSCULAR | Status: AC
  Administered 2010-12-19: 30 mg via INTRAMUSCULAR
  Filled 2010-12-19: qty 1

## 2010-12-19 MED ORDER — SULFAMETHOXAZOLE-TRIMETHOPRIM 800-160 MG PO TABS: 1.0000 | ORAL_TABLET | Freq: Two times a day (BID) | ORAL | Status: DC

## 2010-12-19 NOTE — ED Notes (Signed)
Left flank pain x 3 days

## 2010-12-19 NOTE — ED Provider Notes (Signed)
History     CSN: [id number] Arrival date & time: 12/19/2010  5:35 PM   First MD Initiated Contact with Patient 12/19/10 1737      Chief Complaint  Patient presents with  . Flank Pain    (Consider location/radiation/quality/duration/timing/severity/associated sxs/prior treatment) HPI Comments: Pt states that she has a history of uti's and she feels like she is having another one:p denies any vaginal discharge  Patient is a 40 y.o. female presenting with flank pain. The history is provided by the patient. No language interpreter was used.  Flank Pain This is a recurrent problem. The current episode started in the past 7 days. The problem occurs constantly. The problem has been unchanged. Associated symptoms include abdominal pain. Pertinent negatives include no fever. The symptoms are aggravated by nothing. She has tried nothing for the symptoms.    Past Medical History  Diagnosis Date  . Arthritis   . Bulging discs   . Bladder infection     Past Surgical History  Procedure Date  . Cholecystectomy   . Bladder surgery   . Cervical polypectomy     No family history on file.  History  Substance Use Topics  . Smoking status: Never Smoker   . Smokeless tobacco: Current User    Types: Snuff  . Alcohol Use: No    OB History    Grav Para Term Preterm Abortions TAB SAB Ect Mult Living                  Review of Systems  Constitutional: Negative for fever.  Gastrointestinal: Positive for abdominal pain.  Genitourinary: Positive for flank pain.  All other systems reviewed and are negative.    Allergies  Aspirin and Hydrocodone  Home Medications   Current Outpatient Rx  Name Route Sig Dispense Refill  . DIPHENHYDRAMINE HCL 25 MG PO TABS Oral Take 25 mg by mouth once as needed. For migraine       BP 110/68  Pulse 89  Temp(Src) 98.5 F (36.9 C) (Oral)  Resp 20  Ht 5\' 3"  (1.6 m)  Wt 222 lb (100.699 kg)  BMI 39.33 kg/m2  SpO2 100%  LMP  12/15/2010  Physical Exam  Nursing note and vitals reviewed. Constitutional: She is oriented to person, place, and time. She appears well-developed and well-nourished.  HENT:  Head: Normocephalic and atraumatic.  Cardiovascular: Normal rate and regular rhythm.   Pulmonary/Chest: Effort normal and breath sounds normal.  Abdominal: Soft. Bowel sounds are normal.  Musculoskeletal: Normal range of motion.  Neurological: She is alert and oriented to person, place, and time.  Skin: Skin is warm and dry.  Psychiatric: She has a normal mood and affect.    ED Course  Procedures (including critical care time)  Labs Reviewed  URINALYSIS, ROUTINE W REFLEX MICROSCOPIC - Abnormal; Notable for the following:    Appearance CLOUDY (*)    Hgb urine dipstick SMALL (*)    All other components within normal limits  URINE MICROSCOPIC-ADD ON - Abnormal; Notable for the following:    Squamous Epithelial / LPF MANY (*)    Bacteria, UA FEW (*)    All other components within normal limits  PREGNANCY, URINE  URINE CULTURE   No results found.   1. UTI (lower urinary tract infection)       MDM  Will treat pt for uti        Teressa Lower, NP 12/19/10 1826

## 2010-12-19 NOTE — ED Provider Notes (Signed)
Medical screening examination/treatment/procedure(s) were performed by non-physician practitioner and as supervising physician I was immediately available for consultation/collaboration.   Leiam Hopwood A. Patrica Duel, MD 12/19/10 Paulo Fruit

## 2010-12-21 LAB — URINE CULTURE
Colony Count: 8000
Culture  Setup Time: 201210250639

## 2010-12-29 ENCOUNTER — Encounter (HOSPITAL_COMMUNITY): Payer: Self-pay | Admitting: *Deleted

## 2010-12-29 ENCOUNTER — Inpatient Hospital Stay (HOSPITAL_COMMUNITY)
Admission: AD | Admit: 2010-12-29 | Discharge: 2010-12-29 | Disposition: A | Discharge status: Home / Self Care | Payer: Medicaid Other | Source: Ambulatory Visit | Attending: Family Medicine | Admitting: Family Medicine

## 2010-12-29 DIAGNOSIS — N938 Other specified abnormal uterine and vaginal bleeding: Secondary | ICD-10-CM | POA: Insufficient documentation

## 2010-12-29 DIAGNOSIS — N898 Other specified noninflammatory disorders of vagina: Secondary | ICD-10-CM

## 2010-12-29 DIAGNOSIS — N949 Unspecified condition associated with female genital organs and menstrual cycle: Secondary | ICD-10-CM | POA: Insufficient documentation

## 2010-12-29 DIAGNOSIS — R109 Unspecified abdominal pain: Secondary | ICD-10-CM | POA: Insufficient documentation

## 2010-12-29 DIAGNOSIS — N939 Abnormal uterine and vaginal bleeding, unspecified: Secondary | ICD-10-CM

## 2010-12-29 HISTORY — DX: Urinary tract infection, site not specified: N39.0

## 2010-12-29 HISTORY — DX: Tubulo-interstitial nephritis, not specified as acute or chronic: N12

## 2010-12-29 HISTORY — DX: Polyp of corpus uteri: N84.0

## 2010-12-29 HISTORY — DX: Acute candidiasis of vulva and vagina: B37.31

## 2010-12-29 HISTORY — DX: Candidiasis of vulva and vagina: B37.3

## 2010-12-29 HISTORY — DX: Excessive and frequent menstruation with regular cycle: N92.0

## 2010-12-29 HISTORY — DX: Cystocele, unspecified: N81.10

## 2010-12-29 LAB — URINALYSIS, ROUTINE W REFLEX MICROSCOPIC
Bilirubin Urine: NEGATIVE
Ketones, ur: NEGATIVE mg/dL
Leukocytes, UA: NEGATIVE
Nitrite: NEGATIVE
Protein, ur: NEGATIVE mg/dL
Urobilinogen, UA: 0.2 mg/dL (ref 0.0–1.0)

## 2010-12-29 LAB — WET PREP, GENITAL
Trich, Wet Prep: NONE SEEN
Yeast Wet Prep HPF POC: NONE SEEN

## 2010-12-29 LAB — CBC
Hemoglobin: 13.3 g/dL (ref 12.0–15.0)
MCH: 29.2 pg (ref 26.0–34.0)
MCV: 89.5 fL (ref 78.0–100.0)
RBC: 4.56 MIL/uL (ref 3.87–5.11)
WBC: 6.7 10*3/uL (ref 4.0–10.5)

## 2010-12-29 MED ORDER — OXYCODONE-ACETAMINOPHEN 5-325 MG PO TABS
1.0000 | ORAL_TABLET | Freq: Once | ORAL | Status: AC
  Administered 2010-12-29: 1 via ORAL
  Filled 2010-12-29: qty 1

## 2010-12-29 MED ORDER — TRAMADOL HCL 50 MG PO TABS: 50.0000 mg | ORAL_TABLET | Freq: Four times a day (QID) | ORAL | Status: AC | PRN

## 2010-12-29 NOTE — Progress Notes (Signed)
Pt had implanad placed in September. Started period on 12/15/10. Still spotting now brownish. Still having cramping

## 2010-12-29 NOTE — ED Provider Notes (Signed)
Chart reviewed and agree with management and plan.  

## 2010-12-29 NOTE — ED Provider Notes (Signed)
History     CSN: [id number] Arrival date & time: 12/29/2010 11:23 AM   None     Chief Complaint  Patient presents with  . Vaginal Bleeding   HPI Cathy Rowland is a 40 y.o. female who presents to [facility] for vaginal bleeding. She had implant contraception placed the end of September. She began bleeding like a a period 15 days ago and has continued to have brown discharge and lower abdominal cramping.  She has seen Dr. Clearance Coots in the past but recently lost her insurance so has not been in for this problem. She should have her insurance back in the next couple week and will return to Dr. Clearance Coots.   Past Medical History  Diagnosis Date  . Arthritis   . Bulging discs   . Bladder infection   . Chronic UTI   . Pyelonephritis   . Uterine polyp   . Female bladder prolapse   . Yeast infection of the vagina   . Menorrhagia     Past Surgical History  Procedure Date  . Cholecystectomy   . Bladder surgery   . Cervical polypectomy   . Gynecologic cryosurgery     No family history on file.  History  Substance Use Topics  . Smoking status: Never Smoker   . Smokeless tobacco: Current User    Types: Snuff  . Alcohol Use: No    OB History    Grav Para Term Preterm Abortions TAB SAB Ect Mult Living   3 3 2 1      3       Review of Systems  Allergies  Aspirin and Hydrocodone  Home Medications  No current outpatient prescriptions on file.  Pulse 60  Temp 99.4 F (37.4 C)  Resp 18  Ht 5\' 3"  (1.6 m)  Wt 222 lb (100.699 kg)  BMI 39.33 kg/m2  LMP 12/15/2010  Physical Exam  Nursing note and vitals reviewed. Constitutional: She is oriented to person, place, and time. No distress.       Obese white female.  HENT:  Head: Normocephalic.  Eyes: EOM are normal.  Neck: Neck supple.  Cardiovascular: Normal rate.   Pulmonary/Chest: Effort normal.  Abdominal: Soft. There is no tenderness.  Genitourinary:       External genitalia without lesions. Scant blood vaginal vault, no CMT,  no adnexal tenderness. Unable to reproduce the cramp like pain patient has been having.  Uterus without palpable enlargement. Exam limited due to patient habitus.   Musculoskeletal: Normal range of motion.  Neurological: She is alert and oriented to person, place, and time. No cranial nerve deficit.  Skin: Skin is warm and dry.  Psychiatric: She has a normal mood and affect. Her behavior is normal.   Results for orders placed during the hospital encounter of 12/29/10 (from the past 24 hour(s))  URINALYSIS, ROUTINE W REFLEX MICROSCOPIC     Status: Abnormal   Collection Time   12/29/10 11:45 AM      Component Value Range   Color, Urine YELLOW  YELLOW    Appearance CLEAR  CLEAR    Specific Gravity, Urine 1.015  1.005 - 1.030    pH 5.5  5.0 - 8.0    Glucose, UA NEGATIVE  NEGATIVE (mg/dL)   Hgb urine dipstick LARGE (*) NEGATIVE    Bilirubin Urine NEGATIVE  NEGATIVE    Ketones, ur NEGATIVE  NEGATIVE (mg/dL)   Protein, ur NEGATIVE  NEGATIVE (mg/dL)   Urobilinogen, UA 0.2  0.0 - 1.0 (mg/dL)  Nitrite NEGATIVE  NEGATIVE    Leukocytes, UA NEGATIVE  NEGATIVE   URINE MICROSCOPIC-ADD ON     Status: Abnormal   Collection Time   12/29/10 11:45 AM      Component Value Range   Squamous Epithelial / LPF FEW (*) RARE    WBC, UA 0-2  <3 (WBC/hpf)   RBC / HPF 3-6  <3 (RBC/hpf)   Bacteria, UA FEW (*) RARE   POCT PREGNANCY, URINE     Status: Normal   Collection Time   12/29/10 11:55 AM      Component Value Range   Preg Test, Ur NEGATIVE    WET PREP, GENITAL     Status: Abnormal   Collection Time   12/29/10 12:55 PM      Component Value Range   Yeast, Wet Prep NONE SEEN  NONE SEEN    Trich, Wet Prep NONE SEEN  NONE SEEN    Clue Cells, Wet Prep FEW (*) NONE SEEN    WBC, Wet Prep HPF POC FEW (*) NONE SEEN   CBC     Status: Normal   Collection Time   12/29/10 12:59 PM      Component Value Range   WBC 6.7  4.0 - 10.5 (K/uL)   RBC 4.56  3.87 - 5.11 (MIL/uL)   Hemoglobin 13.3  12.0 - 15.0 (g/dL)    HCT 29.5  62.1 - 30.8 (%)   MCV 89.5  78.0 - 100.0 (fL)   MCH 29.2  26.0 - 34.0 (pg)   MCHC 32.6  30.0 - 36.0 (g/dL)   RDW 65.7  84.6 - 96.2 (%)   Platelets 227  150 - 400 (K/uL)   GC and Chlamydia cultures pending. ED Course  Procedures   Patient can not take Aspirin due to an ulcer. She is allergic to hydrocodone but can take oxycodone without a problem.  Assessment: Abnormal vaginal bleeding due to implant contraception   Abdominal cramping  Plan:  Discussed abnormal bleeding and causes   Percocet for pain   Follow up with Dr. Clearance Coots  MDM          Kerrie Buffalo, NP 12/29/10 1350

## 2011-01-08 IMAGING — MG MM DIGITAL SCREENING
6 series · 6 of 6 positions shown · non-contrast
Comparison: none

DG SCREEN MAMMOGRAM BILATERAL
Bilateral CC and MLO view(s) were taken.
Technologist: [employer], RT RM

DIGITAL SCREENING MAMMOGRAM WITH CAD:
The breast tissue is almost entirely fatty.  There is no dominant mass, architectural distortion or
calcification to suggest malignancy.
Images were processed with CAD.

[R CC]
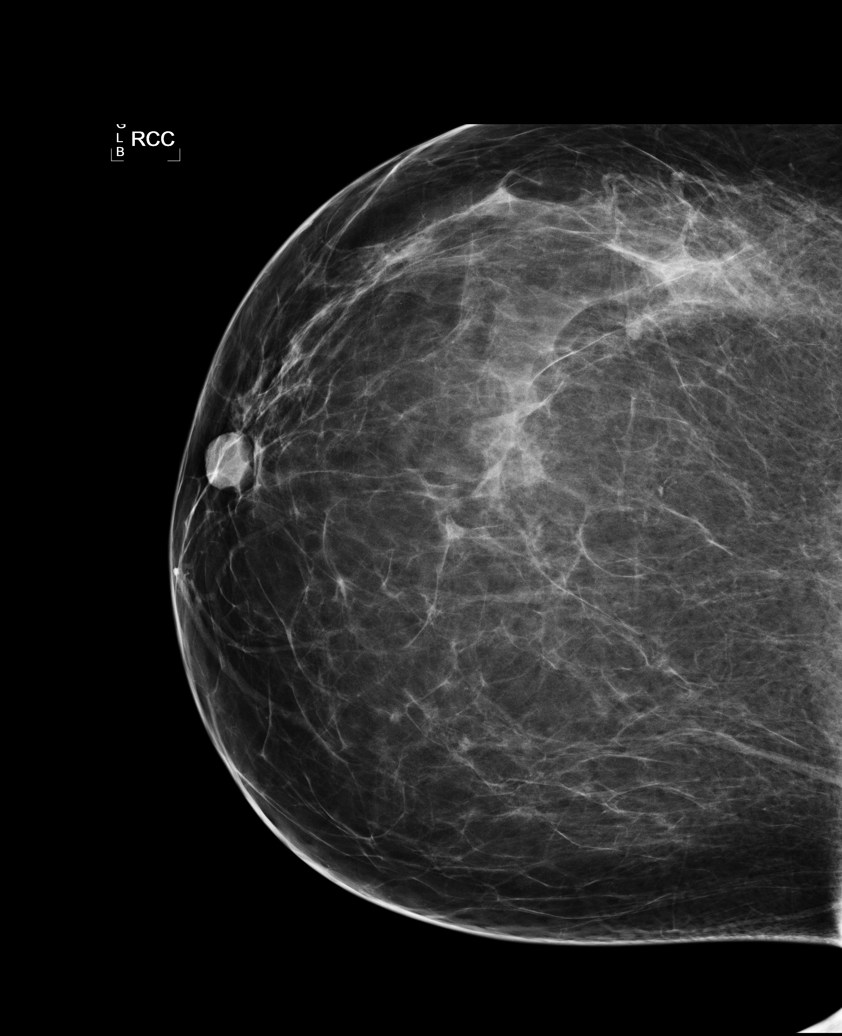

[R MLO (1 of 2)]
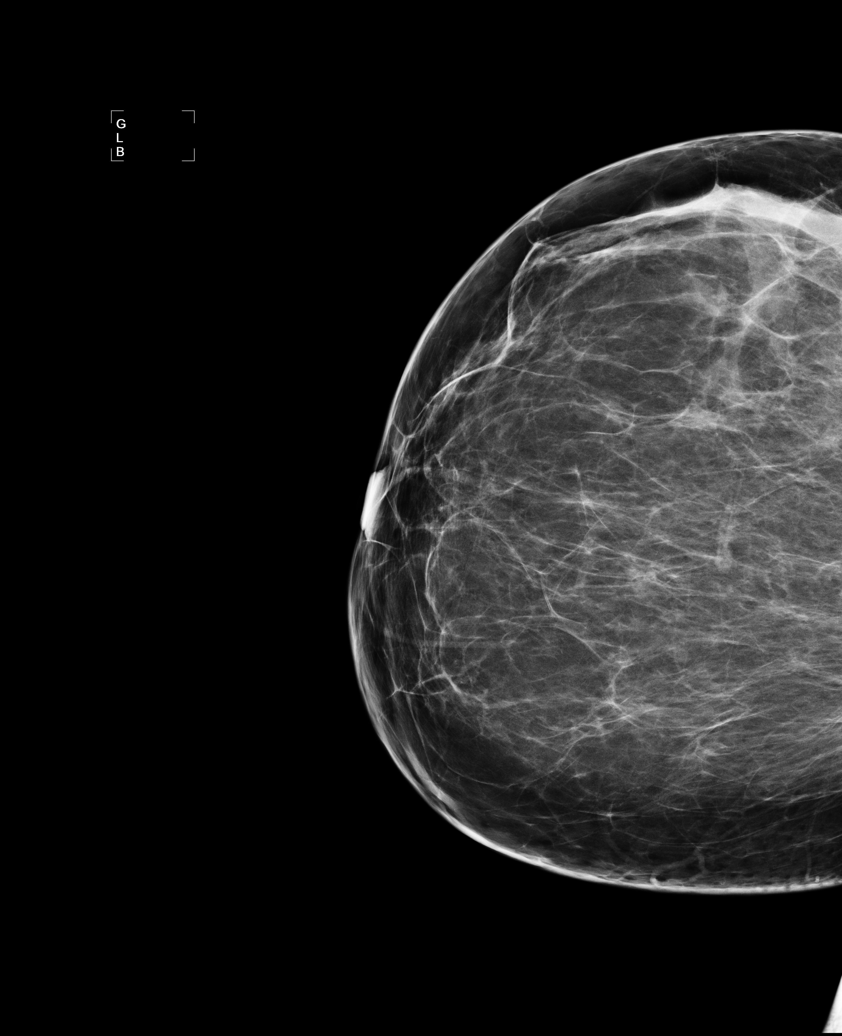

[L CC]
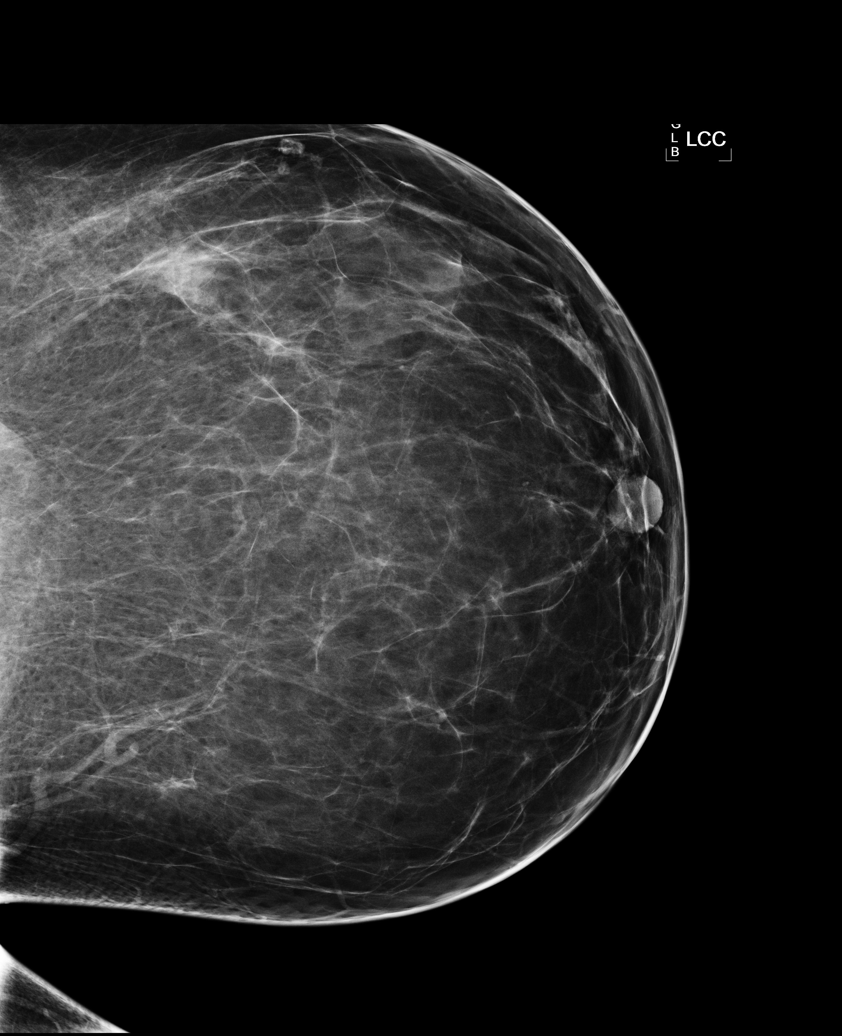

[L MLO (1 of 2)]
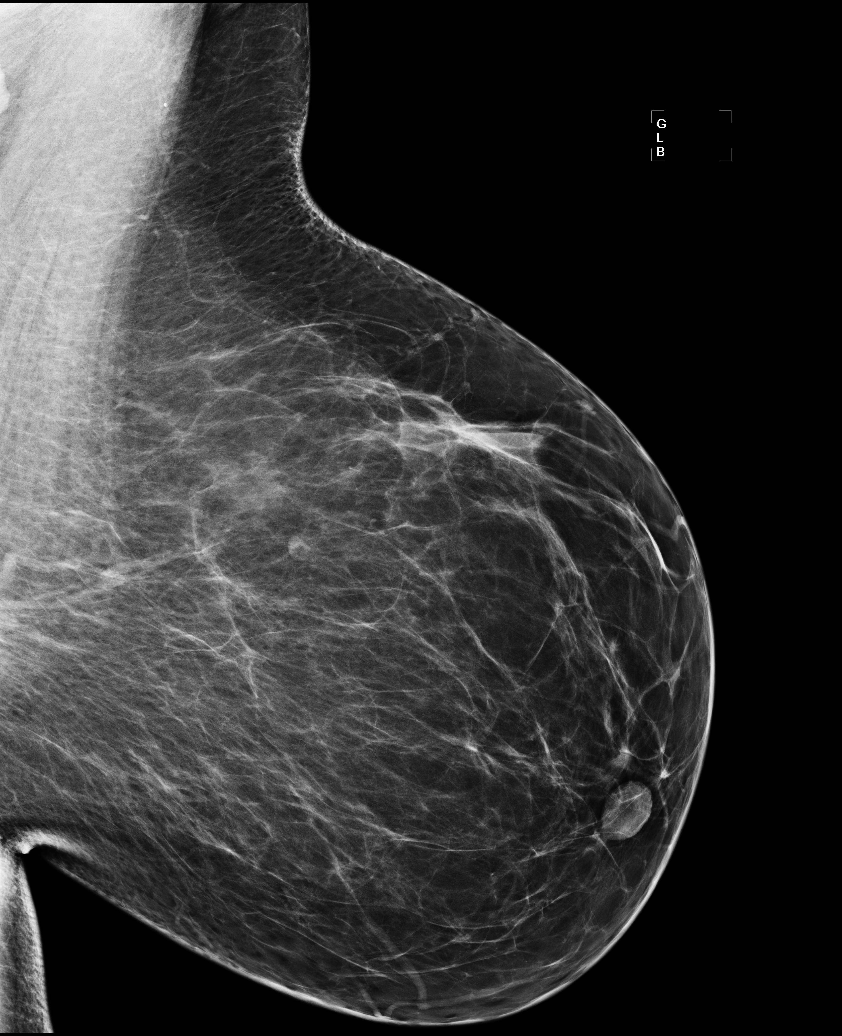

[L MLO (2 of 2)]
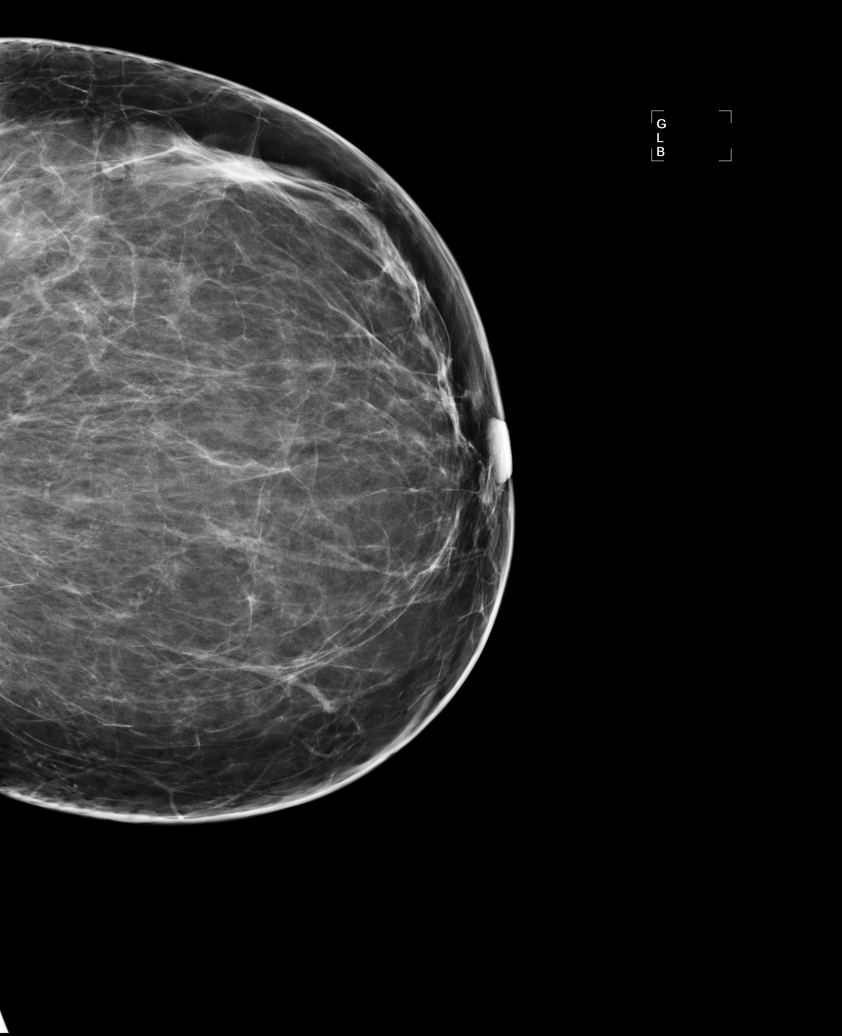

[R MLO (2 of 2)]
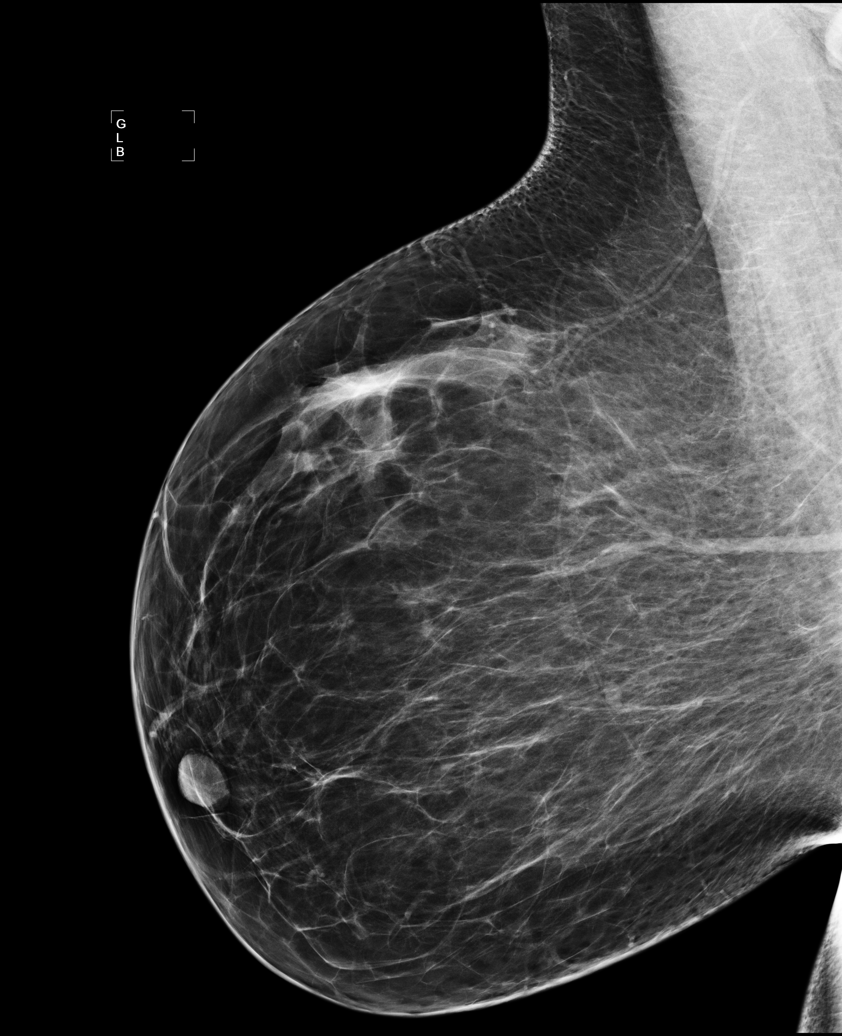

[6 of 6 positions shown; findings below may reference images not displayed]

IMPRESSION: No mammographic evidence of malignancy.  Suggest yearly screening mammography.

A result letter of this screening mammogram will be mailed directly to the patient.

ASSESSMENT: Negative - BI-RADS 1

Screening mammogram in 1 year.
,

## 2011-02-09 ENCOUNTER — Emergency Department (HOSPITAL_BASED_OUTPATIENT_CLINIC_OR_DEPARTMENT_OTHER)
Admission: EM | Admit: 2011-02-09 | Discharge: 2011-02-09 | Disposition: A | Discharge status: Home / Self Care | Payer: Medicaid Other | Attending: Emergency Medicine | Admitting: Emergency Medicine

## 2011-02-09 ENCOUNTER — Encounter (HOSPITAL_BASED_OUTPATIENT_CLINIC_OR_DEPARTMENT_OTHER): Payer: Self-pay | Admitting: Emergency Medicine

## 2011-02-09 DIAGNOSIS — R109 Unspecified abdominal pain: Secondary | ICD-10-CM

## 2011-02-09 DIAGNOSIS — R350 Frequency of micturition: Secondary | ICD-10-CM | POA: Insufficient documentation

## 2011-02-09 DIAGNOSIS — N390 Urinary tract infection, site not specified: Secondary | ICD-10-CM

## 2011-02-09 LAB — URINALYSIS, ROUTINE W REFLEX MICROSCOPIC
Bilirubin Urine: NEGATIVE
Leukocytes, UA: NEGATIVE
Nitrite: NEGATIVE
Specific Gravity, Urine: 1.015 (ref 1.005–1.030)
Urobilinogen, UA: 0.2 mg/dL (ref 0.0–1.0)
pH: 5 (ref 5.0–8.0)

## 2011-02-09 LAB — DIFFERENTIAL
Eosinophils Absolute: 0.2 10*3/uL (ref 0.0–0.7)
Lymphs Abs: 1.7 10*3/uL (ref 0.7–4.0)
Monocytes Relative: 7 % (ref 3–12)
Neutro Abs: 4.5 10*3/uL (ref 1.7–7.7)
Neutrophils Relative %: 65 % (ref 43–77)

## 2011-02-09 LAB — CBC
Hemoglobin: 13.1 g/dL (ref 12.0–15.0)
MCH: 29 pg (ref 26.0–34.0)
Platelets: 228 10*3/uL (ref 150–400)
RBC: 4.51 MIL/uL (ref 3.87–5.11)
WBC: 6.9 10*3/uL (ref 4.0–10.5)

## 2011-02-09 LAB — URINE MICROSCOPIC-ADD ON

## 2011-02-09 LAB — COMPREHENSIVE METABOLIC PANEL
ALT: 8 U/L (ref 0–35)
Albumin: 3.6 g/dL (ref 3.5–5.2)
Alkaline Phosphatase: 53 U/L (ref 39–117)
Chloride: 111 mEq/L (ref 96–112)
Glucose, Bld: 109 mg/dL — ABNORMAL HIGH (ref 70–99)
Potassium: 3.8 mEq/L (ref 3.5–5.1)
Sodium: 144 mEq/L (ref 135–145)
Total Bilirubin: 0.5 mg/dL (ref 0.3–1.2)
Total Protein: 6.6 g/dL (ref 6.0–8.3)

## 2011-02-09 LAB — PREGNANCY, URINE: Preg Test, Ur: NEGATIVE

## 2011-02-09 MED ORDER — OXYCODONE-ACETAMINOPHEN 5-325 MG PO TABS: 2.0000 | ORAL_TABLET | ORAL | Status: AC | PRN

## 2011-02-09 MED ORDER — CEPHALEXIN 500 MG PO CAPS: ORAL_CAPSULE | ORAL | Status: AC

## 2011-02-09 MED ORDER — OXYCODONE-ACETAMINOPHEN 5-325 MG PO TABS
2.0000 | ORAL_TABLET | Freq: Once | ORAL | Status: AC
  Administered 2011-02-09: 2 via ORAL
  Filled 2011-02-09: qty 2

## 2011-02-09 NOTE — ED Provider Notes (Signed)
History     CSN: [id number] Arrival date & time: 02/09/2011  9:41 AM   First MD Initiated Contact with Patient 02/09/11 0957      Chief Complaint  Patient presents with  . Urinary Frequency  . Back Pain  . Chills    (Consider location/radiation/quality/duration/timing/severity/associated sxs/prior treatment) HPI This 40 year old female has chronic recurrent urinary infection symptoms with her last antibiotics in October of this year presenting now with a week to week and a half of constant 24-hour a day sharp right paralumbar flank pain radiating towards her lower abdomen with urinary frequency without dysuria vaginal bleeding or vaginal discharge. She is no fever no cough no chest pain no shortness of breath no rash no trauma and no upper abdominal pain. The pain is not colicky in nothing makes it better or worse. The pain is moderately severe and just like her prior urine infections. She's had no vomiting or diarrhea. Past Medical History  Diagnosis Date  . Arthritis   . Bulging discs   . Bladder infection   . Chronic UTI   . Pyelonephritis   . Uterine polyp   . Female bladder prolapse   . Yeast infection of the vagina   . Menorrhagia     Past Surgical History  Procedure Date  . Cholecystectomy   . Bladder surgery   . Cervical polypectomy   . Gynecologic cryosurgery     History reviewed. No pertinent family history.  History  Substance Use Topics  . Smoking status: Never Smoker   . Smokeless tobacco: Current User    Types: Snuff  . Alcohol Use: No    OB History    Grav Para Term Preterm Abortions TAB SAB Ect Mult Living   3 3 2 1      3       Review of Systems  Constitutional: Negative for fever.       10 Systems reviewed and are negative for acute change except as noted in the HPI.  HENT: Negative for congestion.   Eyes: Negative for discharge and redness.  Respiratory: Negative for cough and shortness of breath.   Cardiovascular: Negative for chest  pain.  Gastrointestinal: Positive for abdominal pain. Negative for nausea, vomiting, diarrhea and blood in stool.  Genitourinary: Positive for frequency and flank pain. Negative for dysuria, hematuria, vaginal bleeding and vaginal discharge.  Musculoskeletal: Negative for back pain.  Skin: Negative for rash.  Neurological: Negative for syncope, numbness and headaches.  Psychiatric/Behavioral:       No behavior change.    Allergies  Aspirin and Hydrocodone  Home Medications   Current Outpatient Rx  Name Route Sig Dispense Refill  . CEPHALEXIN 500 MG PO CAPS  2 caps po bid x 7 days 28 capsule 0  . DIPHENHYDRAMINE HCL 25 MG PO TABS Oral Take 50 mg by mouth once as needed. For migraine    . OXYCODONE-ACETAMINOPHEN 5-325 MG PO TABS Oral Take 2 tablets by mouth every 4 (four) hours as needed for pain. 20 tablet 0    BP 123/74  Pulse 88  Temp(Src) 98.5 F (36.9 C) (Oral)  Resp 18  Ht 5\' 3"  (1.6 m)  Wt 220 lb (99.791 kg)  BMI 38.97 kg/m2  SpO2 95%  LMP 01/15/2011  Physical Exam  Nursing note and vitals reviewed. Constitutional:       Awake, alert, nontoxic appearance.  HENT:  Head: Atraumatic.  Eyes: Conjunctivae are normal. Right eye exhibits no discharge. Left eye exhibits no discharge.  Neck: Neck supple.  Cardiovascular: Normal rate and regular rhythm.   No murmur heard. Pulmonary/Chest: Effort normal and breath sounds normal. No respiratory distress. She has no wheezes. She has no rales. She exhibits no tenderness.  Abdominal: Soft. Bowel sounds are normal. She exhibits no mass. There is tenderness. There is no rebound and no guarding.       Minimal diffuse lower abdominal tenderness with right paralumbar soft tissue tenderness without CVA tenderness and no midline back tenderness  Musculoskeletal: Normal range of motion. She exhibits no edema and no tenderness.       Baseline ROM, no obvious new focal weakness.  Neurological:       Mental status and motor strength  appears baseline for patient and situation.  Skin: No rash noted.  Psychiatric: She has a normal mood and affect.    ED Course  Procedures (including critical care time) The patient has been diagnosed in the past with chronic intermittent recurrent urinary infections and clinically today the is not present suspicious for renal colic peritonitis or sepsis syndrome so I believe outpatient follow up with urology is reasonable as does the patient and her family because she has been followed by urology for the same problems multiple times in the past. Labs Reviewed  URINALYSIS, ROUTINE W REFLEX MICROSCOPIC - Abnormal; Notable for the following:    Hgb urine dipstick LARGE (*)    All other components within normal limits  COMPREHENSIVE METABOLIC PANEL - Abnormal; Notable for the following:    Glucose, Bld 109 (*)    GFR calc non Af Amer 78 (*)    All other components within normal limits  URINE MICROSCOPIC-ADD ON - Abnormal; Notable for the following:    Squamous Epithelial / LPF FEW (*)    Bacteria, UA MANY (*)    All other components within normal limits  PREGNANCY, URINE  CBC  DIFFERENTIAL  URINE CULTURE   No results found.   1. Right flank pain   2. Urinary tract infection       MDM  I doubt any other EMC precluding discharge at this time including, but not necessarily limited to the following:sepsis, renal colic.        Hurman Horn, MD 02/09/11 [id number]

## 2011-02-09 NOTE — ED Notes (Signed)
Pt states she has been having right flank pain radiating to lower pelvis with urinary frequency.  Some chills.  No vaginal discharge.

## 2011-02-10 LAB — URINE CULTURE
Colony Count: 10000
Culture  Setup Time: 201212152207

## 2011-04-01 ENCOUNTER — Encounter (HOSPITAL_BASED_OUTPATIENT_CLINIC_OR_DEPARTMENT_OTHER): Payer: Self-pay | Admitting: *Deleted

## 2011-04-01 ENCOUNTER — Emergency Department (HOSPITAL_BASED_OUTPATIENT_CLINIC_OR_DEPARTMENT_OTHER)
Admission: EM | Admit: 2011-04-01 | Discharge: 2011-04-01 | Disposition: A | Discharge status: Home / Self Care | Payer: Medicaid Other | Attending: Emergency Medicine | Admitting: Emergency Medicine

## 2011-04-01 DIAGNOSIS — Z8739 Personal history of other diseases of the musculoskeletal system and connective tissue: Secondary | ICD-10-CM | POA: Insufficient documentation

## 2011-04-01 DIAGNOSIS — N390 Urinary tract infection, site not specified: Secondary | ICD-10-CM

## 2011-04-01 DIAGNOSIS — R350 Frequency of micturition: Secondary | ICD-10-CM | POA: Insufficient documentation

## 2011-04-01 DIAGNOSIS — R109 Unspecified abdominal pain: Secondary | ICD-10-CM

## 2011-04-01 LAB — URINALYSIS, ROUTINE W REFLEX MICROSCOPIC
Bilirubin Urine: NEGATIVE
Glucose, UA: NEGATIVE mg/dL
Ketones, ur: NEGATIVE mg/dL
Protein, ur: NEGATIVE mg/dL

## 2011-04-01 LAB — URINE MICROSCOPIC-ADD ON

## 2011-04-01 MED ORDER — SULFAMETHOXAZOLE-TRIMETHOPRIM 800-160 MG PO TABS: 1.0000 | ORAL_TABLET | Freq: Two times a day (BID) | ORAL | Status: AC

## 2011-04-01 NOTE — ED Notes (Signed)
Pt given po coca-[place]a. Tolerating well.

## 2011-04-01 NOTE — ED Provider Notes (Signed)
History     CSN: [id number]  Arrival date & time 04/01/11  1037   First MD Initiated Contact with Patient 04/01/11 1217      Chief Complaint  Patient presents with  . Urinary Tract Infection    (Consider location/radiation/quality/duration/timing/severity/associated sxs/prior treatment) Patient is a 41 y.o. female presenting with urinary tract infection. The history is provided by the patient.  Urinary Tract Infection This is a recurrent problem. The current episode started in the past 7 days. The problem occurs constantly. The problem has been unchanged. Pertinent negatives include no chills, fever, nausea, rash or vomiting. Associated symptoms comments: Right flank pain, suprapubic pain. The symptoms are aggravated by nothing. She has tried nothing for the symptoms.  Frequent UTIs, reports this feels typical for her urinary infections  Past Medical History  Diagnosis Date  . Arthritis   . Bulging discs   . Bladder infection   . Chronic UTI   . Pyelonephritis   . Uterine polyp   . Female bladder prolapse   . Yeast infection of the vagina   . Menorrhagia     Past Surgical History  Procedure Date  . Cholecystectomy   . Bladder surgery   . Cervical polypectomy   . Gynecologic cryosurgery     No family history on file.  History  Substance Use Topics  . Smoking status: Never Smoker   . Smokeless tobacco: Current User    Types: Snuff  . Alcohol Use: No    OB History    Grav Para Term Preterm Abortions TAB SAB Ect Mult Living   3 3 2 1      3       Review of Systems  Constitutional: Negative for fever and chills.  Gastrointestinal: Negative for nausea and vomiting.  Genitourinary: Positive for frequency. Negative for dysuria.       See HPI  Skin: Negative for rash.  10 systems reviewed and are negative for acute change except as noted in the HPI.   Allergies  Aspirin and Hydrocodone  Home Medications   Current Outpatient Rx  Name Route Sig Dispense  Refill  . DIPHENHYDRAMINE HCL 25 MG PO TABS Oral Take 50 mg by mouth once as needed. For migraine      BP 127/72  Pulse 104  Temp(Src) 98.7 F (37.1 C) (Oral)  Resp 22  SpO2 100%  Physical Exam  Nursing note and vitals reviewed. Constitutional: She is oriented to person, place, and time. She appears well-developed and well-nourished. No distress.  HENT:  Head: Normocephalic and atraumatic.  Right Ear: External ear normal.  Left Ear: External ear normal.  Eyes: Pupils are equal, round, and reactive to light.  Neck: Normal range of motion. Neck supple.  Cardiovascular: Normal rate, regular rhythm and intact distal pulses.   Pulmonary/Chest: Effort normal. No respiratory distress.  Abdominal: Soft. Bowel sounds are normal. She exhibits no distension. There is no tenderness. There is no rebound and no guarding.       Negative CVAT  Musculoskeletal: Normal range of motion. She exhibits no edema.       Mild right paralumbar tenderness without midline tenderness, deformity, or appreciable spasm.   Neurological: She is alert and oriented to person, place, and time. No cranial nerve deficit.  Skin: Skin is warm and dry. No rash noted.  Psychiatric: She has a normal mood and affect.    ED Course  Procedures (including critical care time)  Labs Reviewed  URINALYSIS, ROUTINE W REFLEX MICROSCOPIC -  Abnormal; Notable for the following:    APPearance CLOUDY (*)    Hgb urine dipstick TRACE (*)    Leukocytes, UA SMALL (*)    All other components within normal limits  URINE MICROSCOPIC-ADD ON - Abnormal; Notable for the following:    Squamous Epithelial / LPF FEW (*)    Bacteria, UA MANY (*)    All other components within normal limits   No results found.   Dx 1: UTI   MDM  Frequent UTIs with c/o same. Typical s/s for her urinary infections. Many bacteria on UA. Afebrile, not tachycardic on my examination, no CVAT- do not suspect pyelo, urosepsis. Will tx for UTI and advise  urologist f/u as needed.        Shaaron Adler, PA-C 04/01/11 1337    Last culture with susceptibility testing performed in May 2012 showed resistance to cipro, susceptibility to bactrim- will rx bactrim today and send new urine culture  Shaaron Adler, PA-C 04/01/11 1339

## 2011-04-01 NOTE — ED Notes (Signed)
Pt feels like she might have a UTI. Right flank pain x 4 days. Hx of same.

## 2011-04-01 NOTE — ED Provider Notes (Signed)
Medical screening examination/treatment/procedure(s) were performed by non-physician practitioner and as supervising physician I was immediately available for consultation/collaboration.   Leigh-Ann Ares Tegtmeyer, MD 04/01/11 1427 

## 2011-04-03 LAB — URINE CULTURE
Colony Count: >=100000
Culture  Setup Time: 201302050104

## 2011-04-04 NOTE — ED Notes (Signed)
+  Urine. Patient treated with Septra. Sensitive to same. Per protocol MD. °

## 2011-04-26 ENCOUNTER — Ambulatory Visit (HOSPITAL_BASED_OUTPATIENT_CLINIC_OR_DEPARTMENT_OTHER): Payer: Medicaid Other

## 2011-06-07 ENCOUNTER — Encounter (HOSPITAL_BASED_OUTPATIENT_CLINIC_OR_DEPARTMENT_OTHER): Payer: Self-pay | Admitting: *Deleted

## 2011-06-07 ENCOUNTER — Emergency Department (HOSPITAL_BASED_OUTPATIENT_CLINIC_OR_DEPARTMENT_OTHER)
Admission: EM | Admit: 2011-06-07 | Discharge: 2011-06-07 | Disposition: A | Discharge status: Home / Self Care | Payer: Self-pay | Attending: Emergency Medicine | Admitting: Emergency Medicine

## 2011-06-07 DIAGNOSIS — N390 Urinary tract infection, site not specified: Secondary | ICD-10-CM | POA: Insufficient documentation

## 2011-06-07 DIAGNOSIS — R109 Unspecified abdominal pain: Secondary | ICD-10-CM | POA: Insufficient documentation

## 2011-06-07 DIAGNOSIS — R1110 Vomiting, unspecified: Secondary | ICD-10-CM | POA: Insufficient documentation

## 2011-06-07 DIAGNOSIS — R300 Dysuria: Secondary | ICD-10-CM | POA: Insufficient documentation

## 2011-06-07 LAB — URINALYSIS, ROUTINE W REFLEX MICROSCOPIC
Bilirubin Urine: NEGATIVE
Glucose, UA: NEGATIVE mg/dL
Ketones, ur: NEGATIVE mg/dL
Leukocytes, UA: NEGATIVE
pH: 5.5 (ref 5.0–8.0)

## 2011-06-07 LAB — URINE MICROSCOPIC-ADD ON

## 2011-06-07 MED ORDER — NITROFURANTOIN MONOHYD MACRO 100 MG PO CAPS: 100.0000 mg | ORAL_CAPSULE | Freq: Two times a day (BID) | ORAL | Status: AC

## 2011-06-07 MED ORDER — NITROFURANTOIN MONOHYD MACRO 100 MG PO CAPS: 100.0000 mg | ORAL_CAPSULE | Freq: Two times a day (BID) | ORAL | Status: DC

## 2011-06-07 NOTE — Discharge Instructions (Signed)

## 2011-06-07 NOTE — ED Notes (Signed)
Lower back pain x 4 days. Nausea. Urinary frequency.

## 2011-06-07 NOTE — ED Notes (Signed)
Pt reports hx of kidney issues and infection. Claims right kidney is smaller than left kidney.  Usually sees a urologist but d/t loss of health insurance, has not been able to go.

## 2011-06-07 NOTE — ED Provider Notes (Signed)
History     CSN: [id number]  Arrival date & time 06/07/11  1733   First MD Initiated Contact with Patient 06/07/11 1820      Chief Complaint  Patient presents with  . Back Pain    (Consider location/radiation/quality/duration/timing/severity/associated sxs/prior treatment) Patient is a 41 y.o. female presenting with dysuria. The history is provided by the patient. No language interpreter was used.  Dysuria  This is a new problem. The problem occurs every urination. The problem has been gradually worsening. The quality of the pain is described as burning. The pain is at a severity of 6/10. The pain is moderate. There has been no fever. The fever has been present for 3 to 4 days. Associated symptoms include vomiting, urgency and flank pain. She has tried nothing for the symptoms. Her past medical history is significant for recurrent UTIs. Her past medical history does not include kidney stones.  Pt is followed by Dr. Annabell Howells.  Pt has frequent utis.  Past Medical History  Diagnosis Date  . Arthritis   . Bulging discs   . Bladder infection   . Chronic UTI   . Pyelonephritis   . Uterine polyp   . Female bladder prolapse   . Yeast infection of the vagina   . Menorrhagia     Past Surgical History  Procedure Date  . Cholecystectomy   . Bladder surgery   . Cervical polypectomy   . Gynecologic cryosurgery   . Wrist surgery     Carpal Tunnel surgery    No family history on file.  History  Substance Use Topics  . Smoking status: Never Smoker   . Smokeless tobacco: Current User    Types: Snuff  . Alcohol Use: No    OB History    Grav Para Term Preterm Abortions TAB SAB Ect Mult Living   3 3 2 1      3       Review of Systems  Gastrointestinal: Positive for vomiting.  Genitourinary: Positive for dysuria, urgency and flank pain.  All other systems reviewed and are negative.    Allergies  Aspirin and Hydrocodone  Home Medications   Current Outpatient Rx  Name  Route Sig Dispense Refill  . DIPHENHYDRAMINE HCL 25 MG PO TABS Oral Take 50 mg by mouth once as needed. For migraine    . VITAMIN B-6 100 MG PO TABS Oral Take 100 mg by mouth daily.    Marland Kitchen VITAMIN B-12 100 MCG PO TABS Oral Take 50 mcg by mouth daily.      BP 122/73  Pulse 96  Resp 20  SpO2 100%  Physical Exam  Nursing note and vitals reviewed. Constitutional: She appears well-developed and well-nourished.  HENT:  Head: Normocephalic and atraumatic.  Right Ear: External ear normal.  Left Ear: External ear normal.  Mouth/Throat: Oropharynx is clear and moist.  Eyes: Conjunctivae are normal. Pupils are equal, round, and reactive to light.  Neck: Normal range of motion. Neck supple.  Cardiovascular: Normal rate.   Pulmonary/Chest: Effort normal.  Abdominal: Soft.  Musculoskeletal: Normal range of motion.  Neurological: She is alert.  Skin: Skin is warm.  Psychiatric: She has a normal mood and affect.    ED Course  Procedures (including critical care time)  Labs Reviewed  URINALYSIS, ROUTINE W REFLEX MICROSCOPIC - Abnormal; Notable for the following:    Hgb urine dipstick SMALL (*)    All other components within normal limits  URINE MICROSCOPIC-ADD ON - Abnormal; Notable for the  following:    Squamous Epithelial / LPF FEW (*)    Bacteria, UA MANY (*)    All other components within normal limits  PREGNANCY, URINE   No results found.   No diagnosis found.    MDM  rx for macrobid       Lonia Skinner [place], [place] 06/07/11 1837

## 2011-06-08 NOTE — ED Provider Notes (Signed)
Medical screening examination/treatment/procedure(s) were performed by non-physician practitioner and as supervising physician I was immediately available for consultation/collaboration.   Forbes Cellar, MD 06/08/11 0045

## 2011-06-28 ENCOUNTER — Emergency Department (HOSPITAL_BASED_OUTPATIENT_CLINIC_OR_DEPARTMENT_OTHER)
Admission: EM | Admit: 2011-06-28 | Discharge: 2011-06-28 | Disposition: A | Discharge status: Home / Self Care | Payer: Self-pay | Attending: Emergency Medicine | Admitting: Emergency Medicine

## 2011-06-28 ENCOUNTER — Encounter (HOSPITAL_BASED_OUTPATIENT_CLINIC_OR_DEPARTMENT_OTHER): Payer: Self-pay | Admitting: Family Medicine

## 2011-06-28 DIAGNOSIS — N390 Urinary tract infection, site not specified: Secondary | ICD-10-CM | POA: Insufficient documentation

## 2011-06-28 LAB — URINALYSIS, ROUTINE W REFLEX MICROSCOPIC
Protein, ur: NEGATIVE mg/dL
Urobilinogen, UA: 0.2 mg/dL (ref 0.0–1.0)

## 2011-06-28 LAB — PREGNANCY, URINE: Preg Test, Ur: NEGATIVE

## 2011-06-28 LAB — URINE MICROSCOPIC-ADD ON

## 2011-06-28 MED ORDER — OXYCODONE-ACETAMINOPHEN 5-325 MG PO TABS
2.0000 | ORAL_TABLET | Freq: Once | ORAL | Status: AC
  Administered 2011-06-28: 2 via ORAL
  Filled 2011-06-28: qty 2

## 2011-06-28 MED ORDER — OXYCODONE-ACETAMINOPHEN 5-325 MG PO TABS: 1.0000 | ORAL_TABLET | Freq: Four times a day (QID) | ORAL | Status: AC | PRN

## 2011-06-28 MED ORDER — CIPROFLOXACIN HCL 500 MG PO TABS: 500.0000 mg | ORAL_TABLET | Freq: Two times a day (BID) | ORAL | Status: AC

## 2011-06-28 NOTE — Discharge Instructions (Signed)

## 2011-06-28 NOTE — ED Notes (Signed)
Pt c/o right flank pain, increased urination and nausea x 4 days. Pt reports recent uti and treatment.

## 2011-06-28 NOTE — ED Provider Notes (Signed)
History     CSN: [id number]  Arrival date & time 06/28/11  1046   First MD Initiated Contact with Patient 06/28/11 1103      Chief Complaint  Patient presents with  . Urinary Tract Infection    (Consider location/radiation/quality/duration/timing/severity/associated sxs/prior treatment) HPI Comments: Has history of frequent utis, treated with macrobid 3 weeks ago.  Got somewhat better, now feels the same.  No n/v.  No fevers.  Patient is a 41 y.o. female presenting with urinary tract infection. The history is provided by the patient.  Urinary Tract Infection This is a recurrent problem. Episode onset: several day ago. The problem occurs constantly. The problem has been gradually worsening. Associated symptoms comments: Flank pain. Exacerbated by: urinating. The symptoms are relieved by nothing.    Past Medical History  Diagnosis Date  . Arthritis   . Bulging discs   . Bladder infection   . Chronic UTI   . Pyelonephritis   . Uterine polyp   . Female bladder prolapse   . Yeast infection of the vagina   . Menorrhagia     Past Surgical History  Procedure Date  . Cholecystectomy   . Bladder surgery   . Cervical polypectomy   . Gynecologic cryosurgery   . Wrist surgery     Carpal Tunnel surgery    No family history on file.  History  Substance Use Topics  . Smoking status: Never Smoker   . Smokeless tobacco: Current User    Types: Snuff  . Alcohol Use: No    OB History    Grav Para Term Preterm Abortions TAB SAB Ect Mult Living   3 3 2 1      3       Review of Systems  All other systems reviewed and are negative.    Allergies  Aspirin and Hydrocodone  Home Medications   Current Outpatient Rx  Name Route Sig Dispense Refill  . DIPHENHYDRAMINE HCL 25 MG PO TABS Oral Take 50 mg by mouth once as needed. For migraine    . VITAMIN B-6 100 MG PO TABS Oral Take 100 mg by mouth daily.    Marland Kitchen VITAMIN B-12 100 MCG PO TABS Oral Take 50 mcg by mouth daily.       BP 128/78  Pulse 86  Temp(Src) 98.6 F (37 C) (Oral)  Resp 16  SpO2 100%  LMP 06/15/2011  Physical Exam  Nursing note and vitals reviewed. Constitutional: She is oriented to person, place, and time. She appears well-developed and well-nourished. No distress.  HENT:  Head: Normocephalic and atraumatic.  Neck: Normal range of motion. Neck supple.  Cardiovascular: Normal rate and regular rhythm.  Exam reveals no gallop and no friction rub.   No murmur heard. Pulmonary/Chest: Effort normal and breath sounds normal. No respiratory distress. She has no wheezes.  Abdominal: Soft. Bowel sounds are normal. She exhibits no distension. There is no tenderness.  Genitourinary:       Mild left flank pain.  Musculoskeletal: Normal range of motion.  Neurological: She is alert and oriented to person, place, and time.  Skin: Skin is warm and dry. She is not diaphoretic.    ED Course  Procedures (including critical care time)   Labs Reviewed  URINALYSIS, ROUTINE W REFLEX MICROSCOPIC  PREGNANCY, URINE   No results found.   No diagnosis found.    MDM  The UA is mildly suggestive of a uti.  She tells me this is how her utis feel.  Will treat with cipro, percocet.        Geoffery Lyons, MD 06/28/11 [phone]

## 2011-07-16 ENCOUNTER — Emergency Department (HOSPITAL_BASED_OUTPATIENT_CLINIC_OR_DEPARTMENT_OTHER): Payer: Self-pay

## 2011-07-16 ENCOUNTER — Encounter (HOSPITAL_BASED_OUTPATIENT_CLINIC_OR_DEPARTMENT_OTHER): Payer: Self-pay | Admitting: *Deleted

## 2011-07-16 ENCOUNTER — Emergency Department (HOSPITAL_BASED_OUTPATIENT_CLINIC_OR_DEPARTMENT_OTHER)
Admission: EM | Admit: 2011-07-16 | Discharge: 2011-07-16 | Disposition: A | Discharge status: Home / Self Care | Payer: Self-pay | Attending: Emergency Medicine | Admitting: Emergency Medicine

## 2011-07-16 DIAGNOSIS — N390 Urinary tract infection, site not specified: Secondary | ICD-10-CM | POA: Insufficient documentation

## 2011-07-16 DIAGNOSIS — M25519 Pain in unspecified shoulder: Secondary | ICD-10-CM | POA: Insufficient documentation

## 2011-07-16 DIAGNOSIS — R350 Frequency of micturition: Secondary | ICD-10-CM | POA: Insufficient documentation

## 2011-07-16 DIAGNOSIS — M1990 Unspecified osteoarthritis, unspecified site: Secondary | ICD-10-CM

## 2011-07-16 DIAGNOSIS — M129 Arthropathy, unspecified: Secondary | ICD-10-CM | POA: Insufficient documentation

## 2011-07-16 DIAGNOSIS — M19019 Primary osteoarthritis, unspecified shoulder: Secondary | ICD-10-CM | POA: Insufficient documentation

## 2011-07-16 DIAGNOSIS — M79609 Pain in unspecified limb: Secondary | ICD-10-CM | POA: Insufficient documentation

## 2011-07-16 LAB — URINE MICROSCOPIC-ADD ON

## 2011-07-16 LAB — URINALYSIS, ROUTINE W REFLEX MICROSCOPIC
Nitrite: POSITIVE — AB
Specific Gravity, Urine: 1.02 (ref 1.005–1.030)
pH: 5.5 (ref 5.0–8.0)

## 2011-07-16 MED ORDER — TRAMADOL HCL 50 MG PO TABS: 50.0000 mg | ORAL_TABLET | Freq: Four times a day (QID) | ORAL | Status: DC | PRN

## 2011-07-16 MED ORDER — SULFAMETHOXAZOLE-TRIMETHOPRIM 800-160 MG PO TABS: 1.0000 | ORAL_TABLET | Freq: Two times a day (BID) | ORAL | Status: AC

## 2011-07-16 NOTE — Discharge Instructions (Signed)
Arthritis, Nonspecific Arthritis is inflammation of a joint. This usually means pain, redness, warmth or swelling are present. One or more joints may be involved. There are a number of types of arthritis. Your caregiver may not be able to tell what type of arthritis you have right away. CAUSES  The most common cause of arthritis is the wear and tear on the joint (osteoarthritis). This causes damage to the cartilage, which can break down over time. The knees, hips, back and neck are most often affected by this type of arthritis. Other types of arthritis and common causes of joint pain include:  Sprains and other injuries near the joint. Sometimes minor sprains and injuries cause pain and swelling that develop hours later.   Rheumatoid arthritis. This affects hands, feet and knees. It usually affects both sides of your body at the same time. It is often associated with chronic ailments, fever, weight loss and general weakness.   Crystal arthritis. Gout and pseudo gout can cause occasional acute severe pain, redness and swelling in the foot, ankle, or knee.   Infectious arthritis. Bacteria can get into a joint through a break in overlying skin. This can cause infection of the joint. Bacteria and viruses can also spread through the blood and affect your joints.   Drug, infectious and allergy reactions. Sometimes joints can become mildly painful and slightly swollen with these types of illnesses.  SYMPTOMS   Pain is the main symptom.   Your joint or joints can also be red, swollen and warm or hot to the touch.   You may have a fever with certain types of arthritis, or even feel overall ill.   The joint with arthritis will hurt with movement. Stiffness is present with some types of arthritis.  DIAGNOSIS  Your caregiver will suspect arthritis based on your description of your symptoms and on your exam. Testing may be needed to find the type of arthritis:  Blood and sometimes urine tests.    X-ray tests and sometimes CT or MRI scans.   Removal of fluid from the joint (arthrocentesis) is done to check for bacteria, crystals or other causes. Your caregiver (or a specialist) will numb the area over the joint with a local anesthetic, and use a needle to remove joint fluid for examination. This procedure is only minimally uncomfortable.   Even with these tests, your caregiver may not be able to tell what kind of arthritis you have. Consultation with a specialist (rheumatologist) may be helpful.  TREATMENT  Your caregiver will discuss with you treatment specific to your type of arthritis. If the specific type cannot be determined, then the following general recommendations may apply. Treatment of severe joint pain includes:  Rest.   Elevation.   Anti-inflammatory medication (for example, ibuprofen) may be prescribed. Avoiding activities that cause increased pain.   Only take over-the-counter or prescription medicines for pain and discomfort as recommended by your caregiver.   Cold packs over an inflamed joint may be used for 10 to 15 minutes every hour. Hot packs sometimes feel better, but do not use overnight. Do not use hot packs if you are diabetic without your caregiver's permission.   A cortisone shot into arthritic joints may help reduce pain and swelling.   Any acute arthritis that gets worse over the next 1 to 2 days needs to be looked at to be sure there is no joint infection.  Long-term arthritis treatment involves modifying activities and lifestyle to reduce joint stress jarring. This can  include weight loss. Also, exercise is needed to nourish the joint cartilage and remove waste. This helps keep the muscles around the joint strong. HOME CARE INSTRUCTIONS   Do not take aspirin to relieve pain if gout is suspected. This elevates uric acid levels.   Only take over-the-counter or prescription medicines for pain, discomfort or fever as directed by your caregiver.    Rest the joint as much as possible.   If your joint is swollen, keep it elevated.   Use crutches if the painful joint is in your leg.   Drinking plenty of fluids may help for certain types of arthritis.   Follow your caregiver's dietary instructions.   Try low-impact exercise such as:   Swimming.   Water aerobics.   Biking.   Walking.   Morning stiffness is often relieved by a warm shower.   Put your joints through regular range-of-motion.  SEEK MEDICAL CARE IF:   You do not feel better in 24 hours or are getting worse.   You have side effects to medications, or are not getting better with treatment.  SEEK IMMEDIATE MEDICAL CARE IF:   You have a fever.   You develop severe joint pain, swelling or redness.   Many joints are involved and become painful and swollen.   There is severe back pain and/or leg weakness.   You have loss of bowel or bladder control.  Document Released: 03/21/2004 Document Revised: 01/31/2011 Document Reviewed: 04/06/2008 [facility] Patient Information 2012 [place], [place].Urinary Tract Infection Infections of the urinary tract can start in several places. A bladder infection (cystitis), a kidney infection (pyelonephritis), and a prostate infection (prostatitis) are different types of urinary tract infections (UTIs). They usually get better if treated with medicines (antibiotics) that kill germs. Take all the medicine until it is gone. You or your child may feel better in a few days, but TAKE ALL MEDICINE or the infection may not respond and may become more difficult to treat. HOME CARE INSTRUCTIONS   Drink enough water and fluids to keep the urine clear or pale yellow. Cranberry juice is especially recommended, in addition to large amounts of water.   Avoid caffeine, tea, and carbonated beverages. They tend to irritate the bladder.   Alcohol may irritate the prostate.   Only take over-the-counter or prescription medicines for pain,  discomfort, or fever as directed by your caregiver.  To prevent further infections:  Empty the bladder often. Avoid holding urine for long periods of time.   After a bowel movement, women should cleanse from front to back. Use each tissue only once.   Empty the bladder before and after sexual intercourse.  FINDING OUT THE RESULTS OF YOUR TEST Not all test results are available during your visit. If your or your child's test results are not back during the visit, make an appointment with your caregiver to find out the results. Do not assume everything is normal if you have not heard from your caregiver or the medical facility. It is important for you to follow up on all test results. SEEK MEDICAL CARE IF:   There is back pain.   Your baby is older than 3 months with a rectal temperature of 100.5 F (38.1 C) or higher for more than 1 day.   Your or your child's problems (symptoms) are no better in 3 days. Return sooner if you or your child is getting worse.  SEEK IMMEDIATE MEDICAL CARE IF:   There is severe back pain or lower abdominal  pain.   You or your child develops chills.   You have a fever.   Your baby is older than 3 months with a rectal temperature of 102 F (38.9 C) or higher.   Your baby is 69 months old or younger with a rectal temperature of 100.4 F (38 C) or higher.   There is nausea or vomiting.   There is continued burning or discomfort with urination.  MAKE SURE YOU:   Understand these instructions.   Will watch your condition.   Will get help right away if you are not doing well or get worse.  Document Released: 11/21/2004 Document Revised: 01/31/2011 Document Reviewed: 06/26/2006 [facility] Patient Information 2012 [place], [place].

## 2011-07-16 NOTE — ED Notes (Signed)
Patient states she developed right shoulder and arm pain one month ago.  States over the last several days the pain has increased.  Using Ibuprofen with no relief.  Denies injury. States she is having urinary problems with frequency, has chronic kidney problems.

## 2011-07-16 NOTE — ED Provider Notes (Signed)
History     CSN: [id number]  Arrival date & time 07/16/11  1113   First MD Initiated Contact with Patient 07/16/11 1203      Chief Complaint  Patient presents with  . Shoulder Pain    right     (Consider location/radiation/quality/duration/timing/severity/associated sxs/prior treatment) HPI Comments: Pt states that she has had ongoing pain in the right shoulder times one month:pt denies any now injury:pt states that the pain shoot down her arm:pt states that the pain is worse if she leaves it still because it feels like it stiffens up:pt states she is not having any numbness or weakness:pt states that she is also having urinary frequency, pt has a history of similar symptoms and was treated with cipro for recent symptoms  The history is provided by the patient. A language interpreter was used.    Past Medical History  Diagnosis Date  . Arthritis   . Bulging discs   . Bladder infection   . Chronic UTI   . Pyelonephritis   . Uterine polyp   . Female bladder prolapse   . Yeast infection of the vagina   . Menorrhagia     Past Surgical History  Procedure Date  . Cholecystectomy   . Bladder surgery   . Cervical polypectomy   . Gynecologic cryosurgery   . Wrist surgery     Carpal Tunnel surgery  . Carpal tunnel release     No family history on file.  History  Substance Use Topics  . Smoking status: Never Smoker   . Smokeless tobacco: Current User    Types: Snuff  . Alcohol Use: No    OB History    Grav Para Term Preterm Abortions TAB SAB Ect Mult Living   3 3 2 1      3       Review of Systems  Constitutional: Negative.   Respiratory: Negative.   Cardiovascular: Negative.   Neurological: Negative.     Allergies  Aspirin and Hydrocodone  Home Medications   Current Outpatient Rx  Name Route Sig Dispense Refill  . DIPHENHYDRAMINE HCL 25 MG PO TABS Oral Take 50 mg by mouth once as needed. For migraine    . VITAMIN B-6 100 MG PO TABS Oral Take 100 mg by  mouth daily.    Marland Kitchen VITAMIN B-12 100 MCG PO TABS Oral Take 50 mcg by mouth daily.      BP 133/82  Pulse 98  Temp(Src) 98.5 F (36.9 C) (Oral)  Resp 20  SpO2 100%  LMP 06/15/2011  Physical Exam  Nursing note and vitals reviewed. Constitutional: She is oriented to person, place, and time. She appears well-developed and well-nourished.  Cardiovascular: Normal rate and regular rhythm.   Pulmonary/Chest: Effort normal and breath sounds normal.  Abdominal: Soft. Bowel sounds are normal. There is no tenderness.  Musculoskeletal: Normal range of motion.       Pt tender in the anterior right shoulder:pt has full WUJ:WJXB strength is equal  Neurological: She is alert and oriented to person, place, and time.  Skin: Skin is warm and dry.    ED Course  Procedures (including critical care time)  Labs Reviewed  URINALYSIS, ROUTINE W REFLEX MICROSCOPIC - Abnormal; Notable for the following:    APPearance CLOUDY (*)    Hgb urine dipstick TRACE (*)    Nitrite POSITIVE (*)    Leukocytes, UA TRACE (*)    All other components within normal limits  URINE MICROSCOPIC-ADD ON - Abnormal; Notable  for the following:    Squamous Epithelial / LPF FEW (*)    Bacteria, UA FEW (*)    All other components within normal limits  URINE CULTURE   Dg Shoulder Right  07/16/2011  *RADIOLOGY REPORT*  Clinical Data: Right shoulder pain, right axillary pain, no known injury  RIGHT SHOULDER - 2+ VIEW  Comparison: None  Findings: AC joint alignment normal with minimal degenerative changes noted. Osseous mineralization normal. Visualized right ribs normal appearance. No acute fracture, dislocation or bone destruction. Probable small bone island at right humeral head.  IMPRESSION: Minimal degenerative changes right AC joint. No acute abnormalities.  Original Report Authenticated By: Lollie Marrow, M.D.     1. UTI (lower urinary tract infection)   2. Degenerative joint disease       MDM  Pt has recently been  treated for uti with macrobid and cipro recently:shoulder showing some degenerative changes:will treat for pain and have follow up with Dr. Vivi Barrack, NP 07/16/11 1359

## 2011-07-16 NOTE — ED Provider Notes (Signed)
Medical screening examination/treatment/procedure(s) were performed by non-physician practitioner and as supervising physician I was immediately available for consultation/collaboration.   Veanna Dower, MD 07/16/11 1511 

## 2011-07-18 LAB — URINE CULTURE: Culture  Setup Time: 201305212353

## 2011-07-19 ENCOUNTER — Ambulatory Visit (INDEPENDENT_AMBULATORY_CARE_PROVIDER_SITE_OTHER): Payer: Self-pay | Admitting: Family Medicine

## 2011-07-19 ENCOUNTER — Encounter: Payer: Self-pay | Admitting: Family Medicine

## 2011-07-19 VITALS — BP 118/82 | HR 84 | Ht 63.0 in | Wt 246.8 lb

## 2011-07-19 DIAGNOSIS — M25511 Pain in right shoulder: Secondary | ICD-10-CM | POA: Insufficient documentation

## 2011-07-19 DIAGNOSIS — M25519 Pain in unspecified shoulder: Secondary | ICD-10-CM

## 2011-07-19 DIAGNOSIS — G5601 Carpal tunnel syndrome, right upper limb: Secondary | ICD-10-CM

## 2011-07-19 DIAGNOSIS — G5600 Carpal tunnel syndrome, unspecified upper limb: Secondary | ICD-10-CM

## 2011-07-19 MED ORDER — PREDNISONE (PAK) 10 MG PO TABS: ORAL_TABLET | ORAL | Status: DC

## 2011-07-19 MED ORDER — MELOXICAM 15 MG PO TABS: 15.0000 mg | ORAL_TABLET | Freq: Every day | ORAL | Status: DC

## 2011-07-19 NOTE — Progress Notes (Signed)
Subjective:    Patient ID: Cathy Rowland, female    DOB: 11-Sep-1970, 41 y.o.   MRN: [id number]  PCP: Dr. Larina Bras  HPI 41 yo F here for right shoulder pain and right wrist/hand numbness.  1. Right shoulder pain Patient reports no known injury. States a month ago started developing right anterior/superior shoulder pain. Pain has intensified since this time and now radiates into right elbow and up back/neck. Worse with overhead, reaching activities. Occasionally taking ibuprofen and using heating pad. Is right handed. Noticed this initially when she woke up. Some burning quality to the pain in same distrubution also. No bowel/bladder dysfunction.  2. Right hand/wrist numbness Known left sided carpal tunnel - was severe so 2 months ago underwent carpal tunnel release. This side is improved but right side still giving intermittent problems. Numbness into hand mostly on radial side. Worse in morning. Not using a brace. Occasional ibuprofen.  Past Medical History  Diagnosis Date  . Arthritis   . Bulging discs   . Bladder infection   . Chronic UTI   . Pyelonephritis   . Uterine polyp   . Female bladder prolapse   . Yeast infection of the vagina   . Menorrhagia     Current Outpatient Prescriptions on File Prior to Visit  Medication Sig Dispense Refill  . pyridOXINE (VITAMIN B-6) 100 MG tablet Take 100 mg by mouth daily.      Marland Kitchen sulfamethoxazole-trimethoprim (SEPTRA DS) 800-160 MG per tablet Take 1 tablet by mouth every 12 (twelve) hours.  6 tablet  0    Past Surgical History  Procedure Date  . Cholecystectomy   . Bladder surgery   . Cervical polypectomy   . Gynecologic cryosurgery   . Wrist surgery     Carpal Tunnel surgery  . Carpal tunnel release     Allergies  Allergen Reactions  . Aspirin Other (See Comments)    Upsets ulcer  . Hydrocodone Other (See Comments)    Fidgety and jittery    History   Social History  . Marital Status: Married    Spouse Name:  N/A    Number of Children: N/A  . Years of Education: N/A   Occupational History  . Not on file.   Social History Main Topics  . Smoking status: Never Smoker   . Smokeless tobacco: Current User    Types: Snuff  . Alcohol Use: No  . Drug Use: No  . Sexually Active: Yes    Birth Control/ Protection: Implant   Other Topics Concern  . Not on file   Social History Narrative  . No narrative on file    Family History  Problem Relation Age of Onset  . Diabetes Father   . Hypertension Father   . Heart attack Neg Hx   . Hyperlipidemia Neg Hx     BP 118/82  Pulse 84  Ht 5\' 3"  (1.6 m)  Wt 246 lb 12.8 oz (111.948 kg)  BMI 43.72 kg/m2  LMP 06/15/2011  Review of Systems See HPI above.    Objective:   Physical Exam Gen: NAD  Neck: No gross deformity, swelling, bruising. TTP right trapezius.  No midline/bony TTP. FROM neck - pain right paraspinal muscles with turning to the left. BUE strength 5/5. Sensation intact to light touch.   Negative spurlings. NV intact distal BUEs.  R shoulder: No swelling, ecchymoses.  No gross deformity. Minimal TTP at biceps tendon and AC joint.  TTP in trapezius, lateral shoulder. FROM with +  painful arc. Equivocal Hawkins, Positive Neers. Positive Speeds, Negative Yergasons. Strength 5/5 with empty can and resisted internal/external rotation - mild pain with empty can. Negative apprehension. NV intact distally.  R wrist: No gross deformity, swelling, bruising, thenar/hypothenar atrophy. No focal TTP. FROM with 5/5 strength wrist flexion/extension, thumb opposition, finger abduction and extension. Sensation intact to light touch all digits. Negative tinels and phalens. NVI distally.     Assessment & Plan:  1. Right shoulder pain - Likely started as rotator cuff impingement but now has evidence of trapezius spasm and biceps strain/tendinopathy as well.  Certainly cervical radiculopathy is a less possible possibility.  Start with  home exercise program (demonstrated today).  Given paperwork to apply for cone coverage as she doesn't have insurance - can start PT if this goes through.  Prednisone with transition to meloxicam as needed.  Percocet q6h prn severe pain #60 - no refills.  F/u in 4-6 weeks for reevaluation.  Consider subacromial injection if not improving as expected.  2. Right carpal tunnel syndrome - mild.  Has actually already had nerve conduction velocities/EMGs done and told she had mild carpal tunnel but had not treatment.  Wrist brace provided today to wear at bedtime.  Prednisone with transition to meloxicam (as needed).  If not improving consider carpal tunnel cortisone injection.

## 2011-07-19 NOTE — Patient Instructions (Signed)
You have carpal tunnel syndrome of your right wrist. Wear the wrist splint at nighttime and as often as possible during the day Take prednisone as directed - AFTER 6 days then start the meloxicam daily with food. Corticosteroid injection is a consideration to help with pain and inflammation if you don't improve as expected. Surgery is considered if injections, bracing, and medicine don't help.  You have rotator cuff impingement that has secondarily led to back spasms and biceps overuse. Try to avoid painful activities (overhead activities, lifting with extended arm, reaching behind) as much as possible. Prednisone then meloxicam as noted above. Percocet as needed for severe pain - no driving on this; we also don't refill this medicine. Subacromial injection may be beneficial to help with pain and to decrease inflammation - will consider this if you don't improve with home exercises and medicine. Do home exercise program with theraband and scapular stabilization exercises daily - these are very important for long term relief even if an injection was given. Follow up with me in 4 weeks for reevaluation.

## 2011-07-19 NOTE — Assessment & Plan Note (Signed)
mild.  Has actually already had nerve conduction velocities/EMGs done and told she had mild carpal tunnel but had not treatment.  Wrist brace provided today to wear at bedtime.  Prednisone with transition to meloxicam (as needed).  If not improving consider carpal tunnel cortisone injection.

## 2011-07-19 NOTE — ED Notes (Signed)
+  Urine. Patient treated with Septra. Sensitive to same. Per protocol MD. °

## 2011-07-19 NOTE — Assessment & Plan Note (Signed)
Likely started as rotator cuff impingement but now has evidence of trapezius spasm and biceps strain/tendinopathy as well.  Certainly cervical radiculopathy is a less possible possibility.  Start with home exercise program (demonstrated today).  Given paperwork to apply for cone coverage as she doesn't have insurance - can start PT if this goes through.  Prednisone with transition to meloxicam as needed.  Percocet q6h prn severe pain #60 - no refills.  F/u in 4-6 weeks for reevaluation.  Consider subacromial injection if not improving as expected.

## 2011-07-29 ENCOUNTER — Encounter (HOSPITAL_BASED_OUTPATIENT_CLINIC_OR_DEPARTMENT_OTHER): Payer: Self-pay

## 2011-07-29 ENCOUNTER — Emergency Department (HOSPITAL_BASED_OUTPATIENT_CLINIC_OR_DEPARTMENT_OTHER)
Admission: EM | Admit: 2011-07-29 | Discharge: 2011-07-30 | Disposition: A | Discharge status: Home / Self Care | Payer: Self-pay | Attending: Emergency Medicine | Admitting: Emergency Medicine

## 2011-07-29 ENCOUNTER — Emergency Department (HOSPITAL_BASED_OUTPATIENT_CLINIC_OR_DEPARTMENT_OTHER): Payer: Self-pay

## 2011-07-29 DIAGNOSIS — R079 Chest pain, unspecified: Secondary | ICD-10-CM | POA: Insufficient documentation

## 2011-07-29 DIAGNOSIS — Z8739 Personal history of other diseases of the musculoskeletal system and connective tissue: Secondary | ICD-10-CM | POA: Insufficient documentation

## 2011-07-29 LAB — COMPREHENSIVE METABOLIC PANEL
CO2: 27 mEq/L (ref 19–32)
Calcium: 9.7 mg/dL (ref 8.4–10.5)
Creatinine, Ser: 0.9 mg/dL (ref 0.50–1.10)
GFR calc Af Amer: 89 mL/min — ABNORMAL LOW (ref >90)
GFR calc non Af Amer: 77 mL/min — ABNORMAL LOW (ref >90)
Glucose, Bld: 128 mg/dL — ABNORMAL HIGH (ref 70–99)

## 2011-07-29 LAB — CBC
Hemoglobin: 13.9 g/dL (ref 12.0–15.0)
MCH: 30 pg (ref 26.0–34.0)
MCHC: 34.2 g/dL (ref 30.0–36.0)
MCV: 87.7 fL (ref 78.0–100.0)
Platelets: 209 10*3/uL (ref 150–400)
RBC: 4.63 MIL/uL (ref 3.87–5.11)

## 2011-07-29 LAB — TROPONIN I: Troponin I: <0.3 ng/mL (ref <0.30)

## 2011-07-29 MED ORDER — MORPHINE SULFATE 4 MG/ML IJ SOLN
4.0000 mg | Freq: Once | INTRAMUSCULAR | Status: AC
  Administered 2011-07-29: 4 mg via INTRAVENOUS
  Filled 2011-07-29: qty 1

## 2011-07-29 MED ORDER — OXYCODONE-ACETAMINOPHEN 5-325 MG PO TABS
2.0000 | ORAL_TABLET | Freq: Once | ORAL | Status: AC
  Administered 2011-07-29: 2 via ORAL
  Filled 2011-07-29: qty 2

## 2011-07-29 NOTE — Discharge Instructions (Signed)
RESOURCE GUIDE  Chronic Pain Problems: Contact [facility]  [phone] Patients need to be referred by their primary care doctor.  Insufficient Money for Medicine: Contact United Way:  call "211" or [facility] [phone].  No Primary Care Doctor: - Call [facility]  [phone] - can help you locate a primary care doctor that  accepts your insurance, provides certain services, etc. - Physician Referral Service- 1-800-533-3463  Agencies that provide inexpensive medical care: - [facility]  [phone] - [facility]  [phone] - [facility]  [phone] - [facility]  [phone] - [facility]  [phone] - [facility]  [phone]  Medicaid-accepting [place] Providers: - [facility]- [address]  [phone], Mon-Fri 9am-7pm, Sat 9am-1pm - [facility]- [address], [address]  [phone] - [facility]- [address], [address]  [phone] - [facility]- [address] [place] [address]  [phone] - Veita Bland- [address], [address], [phone]  Only accepts [place] Medicaid patients after they have their name  applied to their card  Self Pay (no insurance) in [place]: - Sickle Cell Patients: Dr Eric Dean, [facility]  [address], [phone] - [facility]- [address]  [phone]       -     [facility]- [address]       -     [facility]- see information above (Speak to Pam H if you do not have insurance)       -  [facility]- [address], [phone]       -  [facility] [place]- [address],  [phone]       -  [facility]- [address] [place] [address], [phone]       -  Dr Osei-Bonsu-  [address], [place], [phone]       -  [facility]- [address], [phone]       -  [facility]- [address] [place] [address], [phone], also [address], [phone]       -    [facility]- [address], [phone], 1st & 3rd Saturday   every month, 10am-1pm  1) Find a Doctor and Pay Out of Pocket Although you won't have to find out who is covered by your insurance plan, it is a good idea to ask around and get recommendations. You will then need to call the office and see if the doctor you have chosen will accept you as a new patient and what types of options they offer for patients who are self-pay. Some doctors offer discounts or will set up payment plans for their patients who do not have insurance, but you will need to ask so you aren't surprised when you get to your appointment.  2) Contact Your Local Health Department Not all health departments have doctors that can see patients for sick visits, but many do, so it is worth a call to see if yours does. If you don't know where your local health department is, you can check in your phone book. The CDC also has a tool to help you locate your state's health department, and many state websites also have   listings of all of their local health departments.  3) Find a Walk-in Clinic If your illness is not likely to be very severe or complicated, you may want to try a walk in clinic. These are popping up all over the country in pharmacies, drugstores, and shopping centers. They're usually staffed by nurse practitioners or physician assistants that have been trained to treat common illnesses and complaints. They're usually fairly quick and inexpensive. However, if you have serious medical issues or chronic medical problems, these are probably not your best option  STD Testing - [place] [facility] [place], [address], [place], phone [phone] or [phone].  Monday - Friday, call for an appointment. - [place]  [facility] [place], [facility], [address], [place], phone [phone] or 1-877-539-9860.  Monday - Friday, call for an appointment.  Abuse/Neglect: - [place] Child Abuse Hotline (336) 641-3795 - [place] Child Abuse Hotline 800-378-5315 (After Hours)  Emergency Shelter:  [facility] (336) 271-5985  Maternity Homes: - [facility] (336) 275-9566 - [facility] (704) 372-4663  MRSA Hotline #:   832-7006  [place] Resources  [facility] [place]  [employer] [place] [facility] [address]                 [address]         [address]  [place]                                               [place]                              [place] Phone:  [phone]                                  Phone:  [phone]                   Phone:  [phone]  [place] [facility], [phone] - [place] Services - [facility]- 1-888-581-9988       -     [facility] in [place], [address],                                  336-349-4454, Insurance  [place] Child Abuse Hotline (336) 342-1394 or (336) 342-3537 (After Hours)   Behavioral Health Services  Substance Abuse Resources: - [facility]  336-882-2125 - [facility] 336-784-9470 - [facility] 336-285-9073 - [facility] 336-845-3988 - Residential & Outpatient Substance Abuse Program  800-659-3381  Psychological Services: - [facility]  [phone] - [facility]  [phone] - [place], [address], [place], ACCESS LINE: 1-800-853-5163 or 336-641-4981, Http://www.[facility].com/services/adult.htm  Dental Assistance  If unable to pay or uninsured, contact:  [facility] or [place] [facility] to become qualified for the adult dental  clinic.  Patients with Medicaid:  [facility] [address], [phone], [phone]  If unable   to pay, or uninsured, contact [facility] ([phone]) or [place] [facility] ([phone] in [place], [phone] in [place]) to become qualified for the adult dental clinic  Other Low-Cost Community Dental Services: - [facility]- [address], [place], [place], [address], [phone], Ext. 123, 2nd and 4th Thursday of the month at 6:30am.  10 clients each day by appointment, can sometimes see walk-in patients if someone does not show for an appointment. - [facility]- [address], [place], [place], [address], [phone] - [facility]- [address], [place], [place], [address], [phone] - [place] [facility]- [phone] - [facility]- [phone] - [facility]- [phone]    

## 2011-07-29 NOTE — ED Notes (Signed)
C/o left side CP since 10-11am-also c/o neck pain that hurts when she turns her head

## 2011-07-29 NOTE — ED Provider Notes (Signed)
History   This chart was scribed for Cyndra Numbers, MD by Toya Smothers. The patient was seen in room MH10/MH10. Patient's care was started at 2044.  CSN: [id number]  Arrival date & time 07/29/11  2044   First MD Initiated Contact with Patient 07/29/11 2123      Chief Complaint  Patient presents with  . Chest Pain   The history is provided by the patient. No language interpreter was used.   Cathy Rowland is a 41 y.o. female who presents to the Emergency Department complaining of waxing and waning moderate sever chest pain described as tightness onset 12 hours ago with associate burning aggravated with breathing; denying SOB, abdominal pain, cough, and fever. Pt states that she has been under stress for the last couple of days and felt the sudden onset of chest pain while sitting of which the period of highest severity was rated 7/10; currently the pain is rated at 6/10. Pt denies the use of hormones, list a family h/o diabetes of both maternal and paternal lineages, is a currently using chew and snuff tobacco.  She denies radiation of the pain or associated diaphoresis.  She denies personal history of PE or DVT.  Her pain onset was 9 hours prior to presentation. She has not tried anything for her symptoms. There are no other associated or modifying factors.    [facility], w/o cardiac catheretization.   Past Medical History  Diagnosis Date  . Arthritis   . Bulging discs   . Bladder infection   . Chronic UTI   . Pyelonephritis   . Uterine polyp   . Female bladder prolapse   . Yeast infection of the vagina   . Menorrhagia    Past Surgical History  Procedure Date  . Cholecystectomy   . Bladder surgery   . Cervical polypectomy   . Gynecologic cryosurgery   . Wrist surgery     Carpal Tunnel surgery  . Carpal tunnel release    Family History  Problem Relation Age of Onset  . Diabetes Father   . Hypertension Father   . Heart attack Neg Hx   .  Hyperlipidemia Neg Hx    History  Substance Use Topics  . Smoking status: Never Smoker   . Smokeless tobacco: Current User    Types: Snuff  . Alcohol Use: No   OB History    Grav Para Term Preterm Abortions TAB SAB Ect Mult Living   3 3 2 1      3      Review of Systems  Constitutional: Negative for fever and chills.  HENT: Negative for rhinorrhea, sneezing and neck pain.   Eyes: Negative for pain.  Respiratory: Negative for cough and shortness of breath.   Cardiovascular: Positive for chest pain.  Gastrointestinal: Negative for nausea, vomiting, abdominal pain and diarrhea.  Genitourinary: Negative for dysuria and hematuria.  Musculoskeletal: Negative.  Negative for back pain and arthralgias.  Skin: Negative for color change and rash.  Neurological: Negative for weakness and headaches.  Hematological: Negative.   Psychiatric/Behavioral: Negative.   All other systems reviewed and are negative.    Allergies  Aspirin and Hydrocodone  Home Medications   Current Outpatient Rx  Name Route Sig Dispense Refill  . VITAMIN B-6 100 MG PO TABS Oral Take 100 mg by mouth daily.    . MELOXICAM 15 MG PO TABS Oral Take 1 tablet (15 mg total) by mouth daily. With food.  Start AFTER finishing prednisone. 30 tablet 1  . PREDNISONE (PAK) 10 MG PO TABS  6 tabs po day 1, 5 tabs po day 2, 4 tabs po day 3, 3 tabs po day 4, 2 tabs po day 5, 1 tab po day 6 21 tablet 0    BP 120/69  Pulse 85  Temp(Src) 98 F (36.7 C) (Oral)  Resp 22  Ht 5\' 3"  (1.6 m)  Wt 244 lb (110.678 kg)  BMI 43.22 kg/m2  SpO2 99%  LMP 07/15/2011  Physical Exam  Nursing note and vitals reviewed. Constitutional: She is oriented to person, place, and time. She appears well-developed and well-nourished. No distress.  HENT:  Head: Normocephalic and atraumatic.  Eyes: EOM are normal. Pupils are equal, round, and reactive to light.  Neck: Normal range of motion. No tracheal deviation present.  Cardiovascular: Normal  rate.   Pulmonary/Chest: Effort normal and breath sounds normal. No respiratory distress.  Abdominal: Soft. She exhibits no distension. There is no tenderness.  Musculoskeletal: Normal range of motion. She exhibits no edema.  Neurological: She is alert and oriented to person, place, and time. No cranial nerve deficit or sensory deficit. She exhibits normal muscle tone. Coordination normal.  Skin: Skin is warm and dry. No rash noted.  Psychiatric: She has a normal mood and affect. Her behavior is normal.    ED Course  Procedures (including critical care time)   Date: 07/30/2011  Rate: 108  Rhythm: sinus tachycardia  QRS Axis: normal  Intervals: normal  ST/T Wave abnormalities: normal  Conduction Disutrbances:none  Narrative Interpretation: tachy compared with previous  Old EKG Reviewed: changes noted   DIAGNOSTIC STUDIES: Oxygen Saturation is 98% on room air, normal by my interpretation.    COORDINATION OF CARE: 22:28- Ordered blood work, and urine analysis.  Labs Reviewed  COMPREHENSIVE METABOLIC PANEL - Abnormal; Notable for the following:    Sodium 146 (*)    Glucose, Bld 128 (*)    GFR calc non Af Amer 77 (*)    GFR calc Af Amer 89 (*)    All other components within normal limits  CBC  D-DIMER, QUANTITATIVE  TROPONIN I   Dg Chest 2 View  07/29/2011  *RADIOLOGY REPORT*  Clinical Data: Chest pain.  CHEST - 2 VIEW  Comparison: Chest x-ray 03/06/2010.  Findings: Lung volumes are normal.  No consolidative airspace disease.  No pleural effusions.  No pneumothorax.  No pulmonary nodule or mass noted.  Pulmonary vasculature and the cardiomediastinal silhouette are within normal limits.  IMPRESSION: 1. No radiographic evidence of acute cardiopulmonary disease.  Original Report Authenticated By: Florencia Reasons, M.D.    1. Chest pain     MDM  Patient was evaluated and was 9 hours out from the onset of symptoms. She reports allergy to ASA and had not taken this.  Patient  reports she has been evaluated for this 1 year previously and had negative stress test.  She strongly feels that anxiety has affected her symptoms.  Patient was not PERC negative and is a heavy tobacco user.  D-dimer was negative.  There was no anemia or leukocytosis on CBC and BMP was unremarkable.  ECG showed no concerning findings and TNI was negative.  Patient felt better after a single dose of morphine and pain was resolved following 2 percocets.  Patient, family, and I discussed that this was likely related to anxiety given her presentation but that she did need to stop her tobacco use and follow-up with  a PCP.  Second TNI was not performed as patient presented 9 hours after symptoms onset and strongly preferred early discharge.  Patient was not discharged with any pain medications.  She was discharged in good condition with family and both were comfortable with plan. I personally performed the services described in this documentation, which was scribed in my presence. The recorded information has been reviewed and considered.       Cyndra Numbers, MD 07/31/11 [phone]

## 2011-07-29 NOTE — ED Notes (Signed)
MD at bedside. 

## 2011-08-15 ENCOUNTER — Ambulatory Visit: Payer: Self-pay | Admitting: Family Medicine

## 2011-08-16 ENCOUNTER — Ambulatory Visit: Payer: Self-pay | Admitting: Family Medicine

## 2011-09-17 ENCOUNTER — Encounter (HOSPITAL_BASED_OUTPATIENT_CLINIC_OR_DEPARTMENT_OTHER): Payer: Self-pay | Admitting: *Deleted

## 2011-09-17 ENCOUNTER — Encounter: Payer: Self-pay | Admitting: Family Medicine

## 2011-09-17 ENCOUNTER — Emergency Department (HOSPITAL_BASED_OUTPATIENT_CLINIC_OR_DEPARTMENT_OTHER)
Admission: EM | Admit: 2011-09-17 | Discharge: 2011-09-17 | Disposition: A | Discharge status: Home / Self Care | Payer: Self-pay | Attending: Emergency Medicine | Admitting: Emergency Medicine

## 2011-09-17 ENCOUNTER — Ambulatory Visit (INDEPENDENT_AMBULATORY_CARE_PROVIDER_SITE_OTHER): Payer: Self-pay | Admitting: Family Medicine

## 2011-09-17 VITALS — BP 138/91 | HR 94 | Temp 97.9°F | Ht 63.0 in | Wt 240.0 lb

## 2011-09-17 DIAGNOSIS — N390 Urinary tract infection, site not specified: Secondary | ICD-10-CM | POA: Insufficient documentation

## 2011-09-17 DIAGNOSIS — M25519 Pain in unspecified shoulder: Secondary | ICD-10-CM

## 2011-09-17 DIAGNOSIS — G5601 Carpal tunnel syndrome, right upper limb: Secondary | ICD-10-CM

## 2011-09-17 DIAGNOSIS — M25511 Pain in right shoulder: Secondary | ICD-10-CM

## 2011-09-17 DIAGNOSIS — M129 Arthropathy, unspecified: Secondary | ICD-10-CM | POA: Insufficient documentation

## 2011-09-17 DIAGNOSIS — G5600 Carpal tunnel syndrome, unspecified upper limb: Secondary | ICD-10-CM

## 2011-09-17 LAB — URINALYSIS, ROUTINE W REFLEX MICROSCOPIC
Ketones, ur: NEGATIVE mg/dL
Nitrite: NEGATIVE
Protein, ur: NEGATIVE mg/dL
Urobilinogen, UA: 1 mg/dL (ref 0.0–1.0)

## 2011-09-17 LAB — URINE MICROSCOPIC-ADD ON

## 2011-09-17 MED ORDER — NITROFURANTOIN MONOHYD MACRO 100 MG PO CAPS: 100.0000 mg | ORAL_CAPSULE | Freq: Two times a day (BID) | ORAL | Status: AC

## 2011-09-17 NOTE — ED Provider Notes (Signed)
History     CSN: [id number]  Arrival date & time 09/17/11  1700   First MD Initiated Contact with Patient 09/17/11 1706      No chief complaint on file.   (Consider location/radiation/quality/duration/timing/severity/associated sxs/prior treatment) HPI   Patient presents to emergency department complaining of a one-week history of gradual onset increased urinary frequency, urgency, and bladder fullness that she states is similar to her history is of recurrent urine tract infections. Patient states that she has a primary care provider but that she has not seen him in a long time due to financial difficulties and not having health insurance. Patient has taken nothing for symptoms prior to arrival. Symptoms were gradual onset, persistent, and worsening. Denies fevers, chills, flank pain, nausea, vomiting, hematuria, vaginal discharge, or pelvic pain. She denies aggravating or alleviating factors.  Past Medical History  Diagnosis Date  . Arthritis   . Bulging discs   . Bladder infection   . Chronic UTI   . Pyelonephritis   . Uterine polyp   . Female bladder prolapse   . Yeast infection of the vagina   . Menorrhagia     Past Surgical History  Procedure Date  . Cholecystectomy   . Bladder surgery   . Cervical polypectomy   . Gynecologic cryosurgery   . Wrist surgery     Carpal Tunnel surgery  . Carpal tunnel release     Family History  Problem Relation Age of Onset  . Diabetes Father   . Hypertension Father   . Heart attack Neg Hx   . Hyperlipidemia Neg Hx     History  Substance Use Topics  . Smoking status: Never Smoker   . Smokeless tobacco: Current User    Types: Snuff  . Alcohol Use: No    OB History    Grav Para Term Preterm Abortions TAB SAB Ect Mult Living   3 3 2 1      3       Review of Systems  All other systems reviewed and are negative.    Allergies  Aspirin and Hydrocodone  Home Medications  No current outpatient prescriptions on  file.  BP 128/77  Pulse 91  Temp 98 F (36.7 C) (Oral)  Resp 20  SpO2 98%  Physical Exam  Nursing note and vitals reviewed. Constitutional: She is oriented to person, place, and time. She appears well-developed and well-nourished. No distress.       obese  HENT:  Head: Normocephalic and atraumatic.  Eyes: Conjunctivae are normal.  Cardiovascular: Normal rate, regular rhythm, normal heart sounds and intact distal pulses.  Exam reveals no gallop and no friction rub.   No murmur heard. Pulmonary/Chest: Effort normal and breath sounds normal. No respiratory distress. She has no wheezes. She has no rales. She exhibits no tenderness.  Abdominal: Soft. Bowel sounds are normal. She exhibits no distension and no mass. There is no tenderness. There is no rebound and no guarding.       No CVA TTP.   Musculoskeletal: Normal range of motion.  Neurological: She is alert and oriented to person, place, and time.  Skin: Skin is warm and dry. She is not diaphoretic.  Psychiatric: She has a normal mood and affect.    ED Course  Procedures (including critical care time)  Labs Reviewed  URINALYSIS, ROUTINE W REFLEX MICROSCOPIC - Abnormal; Notable for the following:    APPearance CLOUDY (*)     Leukocytes, UA SMALL (*)     All  other components within normal limits  URINE MICROSCOPIC-ADD ON - Abnormal; Notable for the following:    Squamous Epithelial / LPF FEW (*)     Bacteria, UA FEW (*)     All other components within normal limits  PREGNANCY, URINE   No results found.   1. UTI (lower urinary tract infection)       MDM  UTI without complicating factors. appropriate for OP management. No concern for pyelo with patient afebrile, NAD and no CVA TTP.         [place], [place] 09/17/11 1830

## 2011-09-17 NOTE — ED Notes (Signed)
Urinary frequency and urgency

## 2011-09-17 NOTE — ED Provider Notes (Signed)
Medical screening examination/treatment/procedure(s) were performed by non-physician practitioner and as supervising physician I was immediately available for consultation/collaboration.  Cyndra Numbers, MD 09/17/11 [phone]

## 2011-09-18 ENCOUNTER — Encounter: Payer: Self-pay | Admitting: Family Medicine

## 2011-09-18 NOTE — Progress Notes (Signed)
Subjective:    Patient ID: Lorella Nimrod, female    DOB: [birth year], 41 y.o.   MRN: [id number]  PCP: Dr. Larina Bras  Shoulder Pain    41 yo F here for f/u right shoulder pain and right wrist/hand numbness.  5/24: 1. Right shoulder pain Patient reports no known injury. States a month ago started developing right anterior/superior shoulder pain. Pain has intensified since this time and now radiates into right elbow and up back/neck. Worse with overhead, reaching activities. Occasionally taking ibuprofen and using heating pad. Is right handed. Noticed this initially when she woke up. Some burning quality to the pain in same distrubution also. No bowel/bladder dysfunction.  2. Right hand/wrist numbness Known left sided carpal tunnel - was severe so 2 months ago underwent carpal tunnel release. This side is improved but right side still giving intermittent problems. Numbness into hand mostly on radial side. Worse in morning. Not using a brace. Occasional ibuprofen.  7/23: Patient reports prednisone helped quite a bit with her right shoulder pain but returned after a few weeks. Pain radiates from right posterior and lateral shoulder past elbow into hand at times. Also has known carpal tunnel on this side - wears brace. States unable to get comfortable, + night pain. Taking mobic. Worse lying on right shoulder. Describes burning along with the pain. Percocet helped some. Lack of insurance a major barrier to her care.  Past Medical History  Diagnosis Date  . Arthritis   . Bulging discs   . Bladder infection   . Chronic UTI   . Pyelonephritis   . Uterine polyp   . Female bladder prolapse   . Yeast infection of the vagina   . Menorrhagia     No current outpatient prescriptions on file prior to visit.    Past Surgical History  Procedure Date  . Cholecystectomy   . Bladder surgery   . Cervical polypectomy   . Gynecologic cryosurgery   . Wrist surgery     Carpal Tunnel  surgery  . Carpal tunnel release     Allergies  Allergen Reactions  . Aspirin Other (See Comments)    Upsets ulcer  . Hydrocodone Other (See Comments)    Fidgety and jittery    History   Social History  . Marital Status: Married    Spouse Name: N/A    Number of Children: N/A  . Years of Education: N/A   Occupational History  . Not on file.   Social History Main Topics  . Smoking status: Never Smoker   . Smokeless tobacco: Current User    Types: Snuff  . Alcohol Use: No  . Drug Use: No  . Sexually Active: Yes    Birth Control/ Protection: Implant   Other Topics Concern  . Not on file   Social History Narrative  . No narrative on file    Family History  Problem Relation Age of Onset  . Diabetes Father   . Hypertension Father   . Heart attack Neg Hx   . Hyperlipidemia Neg Hx     BP 138/91  Pulse 94  Temp 97.9 F (36.6 C) (Oral)  Ht 5\' 3"  (1.6 m)  Wt 240 lb (108.863 kg)  BMI 42.51 kg/m2  Review of Systems  See HPI above.    Objective:   Physical Exam  Gen: NAD  Neck: No gross deformity, swelling, bruising. TTP right trapezius.  No midline/bony TTP. FROM neck - no pain with motion. BUE strength 5/5. Sensation intact  to light touch currently BUEs. MSKs 2+ bilateral biceps and triceps tendons, 1+ brachioradialis tendons. Negative spurlings. NV intact distal BUEs.  R shoulder: No swelling, ecchymoses.  No gross deformity. Minimal TTP at biceps tendon and AC joint.  TTP in trapezius, lateral shoulder. FROM with + painful arc. Equivocal Hawkins, Negative Neers. Negative Speeds, Negative Yergasons. Strength 5/5 with empty can and resisted internal/external rotation - mild pain with empty can. Negative apprehension. NV intact distally.     Assessment & Plan:  1. Right shoulder pain - Patient has elements on history that suggest both rotator cuff impingement and radiculopathy with distribution of pain, associated burning but pain with right  shoulder motions.  Options are somewhat limited given she does not have insurance - working on [employer] [employer] and [facility] coverage.  Will go ahead with subacromial injection today for diagnostic and therapeutic purposes.  Refilled percocet one time - q6h prn severe pain #60.  Continue HEP.  Once insurance goes through would add PT, consider MRI and/or ultrasound.  Mobic as needed.  After informed written consent, patient was seated on exam table. Right shoulder was prepped with alcohol swab and utilizing posterior approach, patient's right subacromial space was injected with 3:1 marcaine: depomedrol. Patient tolerated the procedure well without immediate complications.  2. Right carpal tunnel syndrome - mild.  Has actually already had nerve conduction velocities/EMGs done and told she had mild carpal tunnel but had not treatment.  Continuing with wrist brace - declined injection for this at this time.

## 2011-09-18 NOTE — Assessment & Plan Note (Signed)
mild.  Has actually already had nerve conduction velocities/EMGs done and told she had mild carpal tunnel but had not treatment.  Continuing with wrist brace - declined injection for this at this time.

## 2011-09-18 NOTE — Assessment & Plan Note (Signed)
Patient has elements on history that suggest both rotator cuff impingement and radiculopathy with distribution of pain, associated burning but pain with right shoulder motions.  Options are somewhat limited given Cathy Rowland does not have insurance - working on [facility] [employer] and [facility] coverage.  Will go ahead with subacromial injection today for diagnostic and therapeutic purposes.  Refilled percocet one time - q6h prn severe pain #60.  Continue HEP.  Once insurance goes through would add PT, consider MRI and/or ultrasound.  Mobic as needed.  After informed written consent, patient was seated on exam table. Right shoulder was prepped with alcohol swab and utilizing posterior approach, patient's right subacromial space was injected with 3:1 marcaine: depomedrol. Patient tolerated the procedure well without immediate complications.

## 2011-10-24 ENCOUNTER — Ambulatory Visit: Payer: Self-pay | Admitting: Family Medicine

## 2011-10-25 ENCOUNTER — Encounter (HOSPITAL_BASED_OUTPATIENT_CLINIC_OR_DEPARTMENT_OTHER): Payer: Self-pay | Admitting: *Deleted

## 2011-10-25 ENCOUNTER — Emergency Department (HOSPITAL_BASED_OUTPATIENT_CLINIC_OR_DEPARTMENT_OTHER)
Admission: EM | Admit: 2011-10-25 | Discharge: 2011-10-25 | Disposition: A | Discharge status: Home / Self Care | Payer: Self-pay | Attending: Emergency Medicine | Admitting: Emergency Medicine

## 2011-10-25 DIAGNOSIS — Z886 Allergy status to analgesic agent status: Secondary | ICD-10-CM | POA: Insufficient documentation

## 2011-10-25 DIAGNOSIS — M129 Arthropathy, unspecified: Secondary | ICD-10-CM | POA: Insufficient documentation

## 2011-10-25 DIAGNOSIS — N390 Urinary tract infection, site not specified: Secondary | ICD-10-CM | POA: Insufficient documentation

## 2011-10-25 LAB — URINALYSIS, ROUTINE W REFLEX MICROSCOPIC
Bilirubin Urine: NEGATIVE
Glucose, UA: NEGATIVE mg/dL
Ketones, ur: NEGATIVE mg/dL
Leukocytes, UA: NEGATIVE
Nitrite: POSITIVE — AB
Protein, ur: NEGATIVE mg/dL
Specific Gravity, Urine: 1.03 (ref 1.005–1.030)
Urobilinogen, UA: 0.2 mg/dL (ref 0.0–1.0)
pH: 5.5 (ref 5.0–8.0)

## 2011-10-25 LAB — URINE MICROSCOPIC-ADD ON

## 2011-10-25 MED ORDER — TRAMADOL HCL 50 MG PO TABS: 50.0000 mg | ORAL_TABLET | Freq: Four times a day (QID) | ORAL | Status: AC | PRN

## 2011-10-25 MED ORDER — CIPROFLOXACIN HCL 250 MG PO TABS: 250.0000 mg | ORAL_TABLET | Freq: Two times a day (BID) | ORAL | Status: AC

## 2011-10-25 MED ORDER — PROMETHAZINE HCL 25 MG PO TABS: 25.0000 mg | ORAL_TABLET | Freq: Four times a day (QID) | ORAL | Status: DC | PRN

## 2011-10-25 NOTE — ED Notes (Signed)
Santina Evans, PA-C at bedside

## 2011-10-25 NOTE — ED Provider Notes (Signed)
History     CSN: [id number]  Arrival date & time 10/25/11  1254   First MD Initiated Contact with Patient 10/25/11 1345      Chief Complaint  Patient presents with  . Abdominal Pain    (Consider location/radiation/quality/duration/timing/severity/associated sxs/prior treatment) HPI Hx from pt. Cathy Rowland is a 41 y.o. female complaining of a 3-4 day history of gradual onset increased urinary frequency, urgency, and bladder fullness that she states is similar to her history is of recurrent urine tract infections. Patient states that she has a primary care provider but that she has not seen him in a long time due to financial difficulties and not having health insurance. Patient has taken nothing for symptoms prior to arrival. Symptoms were gradual onset, persistent, and worsening. Has had slight associated L sided flank pain. Denies fevers, chills, nausea, vomiting, hematuria, vaginal discharge, or pelvic pain. She denies aggravating or alleviating factors.  Review of previous chart indicates that pt has been treated for UTI in ED several times recently, most recently on macrobid in late July.   Past Medical History  Diagnosis Date  . Arthritis   . Bulging discs   . Bladder infection   . Chronic UTI   . Pyelonephritis   . Uterine polyp   . Female bladder prolapse   . Yeast infection of the vagina   . Menorrhagia     Past Surgical History  Procedure Date  . Cholecystectomy   . Bladder surgery   . Cervical polypectomy   . Gynecologic cryosurgery   . Wrist surgery     Carpal Tunnel surgery  . Carpal tunnel release     Family History  Problem Relation Age of Onset  . Diabetes Father   . Hypertension Father   . Heart attack Neg Hx   . Hyperlipidemia Neg Hx     History  Substance Use Topics  . Smoking status: Never Smoker   . Smokeless tobacco: Current User    Types: Snuff  . Alcohol Use: No    OB History    Grav Para Term Preterm Abortions TAB SAB Ect Mult  Living   3 3 2 1      3       Review of Systems as per HPI  Allergies  Aspirin and Hydrocodone  Home Medications   Current Outpatient Rx  Name Route Sig Dispense Refill  . CIPROFLOXACIN HCL 250 MG PO TABS Oral Take 1 tablet (250 mg total) by mouth every 12 (twelve) hours. 14 tablet 0  . PROMETHAZINE HCL 25 MG PO TABS Oral Take 1 tablet (25 mg total) by mouth every 6 (six) hours as needed for nausea. 20 tablet 0  . TRAMADOL HCL 50 MG PO TABS Oral Take 1 tablet (50 mg total) by mouth every 6 (six) hours as needed for pain. 10 tablet 0    BP 127/77  Pulse 107  Temp 98.4 F (36.9 C) (Oral)  Resp 20  SpO2 99%  Physical Exam  Nursing note and vitals reviewed. Constitutional: She appears well-developed and well-nourished. No distress.  HENT:  Head: Normocephalic and atraumatic.  Neck: Normal range of motion.  Cardiovascular: Normal rate, regular rhythm and normal heart sounds.   Pulmonary/Chest: Effort normal and breath sounds normal.  Abdominal: Soft. Bowel sounds are normal. There is tenderness (slight suprapubic tenderness without rebound or guard). There is no rebound and no guarding.       Slight L CVA tenderness  Musculoskeletal: Normal range of motion.  Neurological: She is alert.  Skin: Skin is warm and dry. She is not diaphoretic.  Psychiatric: She has a normal mood and affect.    ED Course  Procedures (including critical care time)  Labs Reviewed  URINALYSIS, ROUTINE W REFLEX MICROSCOPIC - Abnormal; Notable for the following:    APPearance CLOUDY (*)     Hgb urine dipstick TRACE (*)     Nitrite POSITIVE (*)     All other components within normal limits  URINE MICROSCOPIC-ADD ON - Abnormal; Notable for the following:    Squamous Epithelial / LPF FEW (*)     Bacteria, UA MANY (*)     All other components within normal limits   No results found.   1. UTI (lower urinary tract infection)       MDM  Pt with hx of recurrent UTIs presents with sx c/w  previous UTIs. Slight suprapubic and L CVA tenderness. Pt is afebrile, nontoxic appearing, has not been vomiting. Feel she is appropriate for outpatient management. Rx for cipro. Urine sent for cx. Reasons to return to ED discussed.        Grant Fontana, PA-C 10/25/11 1549

## 2011-10-25 NOTE — ED Notes (Signed)
Abdominal pain. Dysuria 4 days.

## 2011-10-29 ENCOUNTER — Ambulatory Visit: Payer: Self-pay | Admitting: Family Medicine

## 2011-10-31 ENCOUNTER — Ambulatory Visit (INDEPENDENT_AMBULATORY_CARE_PROVIDER_SITE_OTHER): Payer: Self-pay | Admitting: Family Medicine

## 2011-10-31 ENCOUNTER — Encounter: Payer: Self-pay | Admitting: Family Medicine

## 2011-10-31 VITALS — BP 117/81 | HR 94 | Ht 63.0 in | Wt 246.8 lb

## 2011-10-31 DIAGNOSIS — M25519 Pain in unspecified shoulder: Secondary | ICD-10-CM

## 2011-10-31 DIAGNOSIS — M25511 Pain in right shoulder: Secondary | ICD-10-CM

## 2011-11-01 ENCOUNTER — Encounter: Payer: Self-pay | Admitting: Family Medicine

## 2011-11-01 NOTE — Assessment & Plan Note (Signed)
Patient's history and exam, improvement completely with injection to subacromial bursa all suggest her pain is due to rotator cuff impingement.  To date she still has not applied for medicaid or [facility] coverage despite insistence at our office visits.  Has HEP to do.  Given repeat subacromial injection but advised would not repeat this for another 3 months.  MUST get insurance so we can start PT, consider MRI - states has all medicaid stuff together and will send in within the week.  Asked about refill of percocet - denied this - tramadol is strongest we will give at this point - percocet is not an appropriate treatment for chronic shoulder pain.  After informed written consent, patient was seated on exam table. Right shoulder was prepped with alcohol swab and utilizing posterior approach, patient's right subacromial space was injected with 3:1 marcaine: depomedrol. Patient tolerated the procedure well without immediate complications.

## 2011-11-01 NOTE — Progress Notes (Signed)
Subjective:    Patient ID: Cathy Rowland, female    DOB: 03-23-1970, 41 y.o.   MRN: [id number]  PCP: Dr. Larina Bras  Shoulder Pain    41 yo F here for f/u right shoulder pain.  5/24: 1. Right shoulder pain Patient reports no known injury. States a month ago started developing right anterior/superior shoulder pain. Pain has intensified since this time and now radiates into right elbow and up back/neck. Worse with overhead, reaching activities. Occasionally taking ibuprofen and using heating pad. Is right handed. Noticed this initially when she woke up. Some burning quality to the pain in same distrubution also. No bowel/bladder dysfunction.  2. Right hand/wrist numbness Known left sided carpal tunnel - was severe so 2 months ago underwent carpal tunnel release. This side is improved but right side still giving intermittent problems. Numbness into hand mostly on radial side. Worse in morning. Not using a brace. Occasional ibuprofen.  7/23: Patient reports prednisone helped quite a bit with her right shoulder pain but returned after a few weeks. Pain radiates from right posterior and lateral shoulder past elbow into hand at times. Also has known carpal tunnel on this side - wears brace. States unable to get comfortable, + night pain. Taking mobic. Worse lying on right shoulder. Describes burning along with the pain. Percocet helped some. Lack of insurance a major barrier to her care.  9/5: Patient states injection caused relief of her right shoulder pain for 2 weeks but it started to come back and is again severe. Taking tramadol for pain. Unable to get comfortable. Pain worse with all shoulder motions. + night pain. Radiates from outside right shoulder to elbow.  Past Medical History  Diagnosis Date  . Arthritis   . Bulging discs   . Bladder infection   . Chronic UTI   . Pyelonephritis   . Uterine polyp   . Female bladder prolapse   . Yeast infection of the vagina    . Menorrhagia     Current Outpatient Prescriptions on File Prior to Visit  Medication Sig Dispense Refill  . ciprofloxacin (CIPRO) 250 MG tablet Take 1 tablet (250 mg total) by mouth every 12 (twelve) hours.  14 tablet  0  . promethazine (PHENERGAN) 25 MG tablet Take 1 tablet (25 mg total) by mouth every 6 (six) hours as needed for nausea.  20 tablet  0  . traMADol (ULTRAM) 50 MG tablet Take 1 tablet (50 mg total) by mouth every 6 (six) hours as needed for pain.  10 tablet  0    Past Surgical History  Procedure Date  . Cholecystectomy   . Bladder surgery   . Cervical polypectomy   . Gynecologic cryosurgery   . Wrist surgery     Carpal Tunnel surgery  . Carpal tunnel release     Allergies  Allergen Reactions  . Aspirin Other (See Comments)    Upsets ulcer  . Hydrocodone Other (See Comments)    Fidgety and jittery    History   Social History  . Marital Status: Married    Spouse Name: N/A    Number of Children: N/A  . Years of Education: N/A   Occupational History  . Not on file.   Social History Main Topics  . Smoking status: Never Smoker   . Smokeless tobacco: Current User    Types: Snuff  . Alcohol Use: No  . Drug Use: No  . Sexually Active: Yes    Birth Control/ Protection: Implant  Other Topics Concern  . Not on file   Social History Narrative  . No narrative on file    Family History  Problem Relation Age of Onset  . Diabetes Father   . Hypertension Father   . Heart attack Neg Hx   . Hyperlipidemia Neg Hx     BP 117/81  Pulse 94  Ht 5\' 3"  (1.6 m)  Wt 246 lb 12.8 oz (111.948 kg)  BMI 43.72 kg/m2  Review of Systems  See HPI above.    Objective:   Physical Exam  Gen: NAD  R shoulder: No swelling, ecchymoses.  No gross deformity. Minimal TTP at biceps tendon and AC joint.  TTP in trapezius, lateral shoulder. FROM with + painful arc. Positive Hawkins and Neers. Negative Yergasons. Strength 5/5 with empty can and resisted  internal/external rotation - pain with empty can. NV intact distally.  L shoulder: FROM without pain or weakness.    Assessment & Plan:  1. Right shoulder pain - Patient's history and exam, improvement completely with injection to subacromial bursa all suggest her pain is due to rotator cuff impingement.  To date she still has not applied for medicaid or [facility] coverage despite insistence at our office visits.  Has HEP to do.  Given repeat subacromial injection but advised would not repeat this for another 3 months.  MUST get insurance so we can start PT, consider MRI - states has all medicaid stuff together and will send in within the week.  Asked about refill of percocet - denied this - tramadol is strongest we will give at this point - percocet is not an appropriate treatment for chronic shoulder pain.  After informed written consent, patient was seated on exam table. Right shoulder was prepped with alcohol swab and utilizing posterior approach, patient's right subacromial space was injected with 3:1 marcaine: depomedrol. Patient tolerated the procedure well without immediate complications.

## 2011-11-17 ENCOUNTER — Encounter (HOSPITAL_COMMUNITY): Payer: Self-pay | Admitting: Emergency Medicine

## 2011-11-17 ENCOUNTER — Emergency Department (HOSPITAL_COMMUNITY)
Admission: EM | Admit: 2011-11-17 | Discharge: 2011-11-17 | Disposition: A | Discharge status: Home / Self Care | Payer: Medicaid Other | Source: Home / Self Care | Attending: Emergency Medicine | Admitting: Emergency Medicine

## 2011-11-17 DIAGNOSIS — N390 Urinary tract infection, site not specified: Secondary | ICD-10-CM

## 2011-11-17 DIAGNOSIS — N3090 Cystitis, unspecified without hematuria: Secondary | ICD-10-CM

## 2011-11-17 LAB — POCT URINALYSIS DIP (DEVICE)
Bilirubin Urine: NEGATIVE
Glucose, UA: NEGATIVE mg/dL
Ketones, ur: NEGATIVE mg/dL
Nitrite: POSITIVE — AB

## 2011-11-17 MED ORDER — PHENAZOPYRIDINE HCL 200 MG PO TABS: 200.0000 mg | ORAL_TABLET | Freq: Three times a day (TID) | ORAL | Status: DC | PRN

## 2011-11-17 MED ORDER — SULFAMETHOXAZOLE-TRIMETHOPRIM 800-160 MG PO TABS: 1.0000 | ORAL_TABLET | Freq: Two times a day (BID) | ORAL | Status: DC

## 2011-11-17 NOTE — ED Provider Notes (Signed)
History     CSN: [id number]  Arrival date & time 11/17/11  1014   First MD Initiated Contact with Patient 11/17/11 1036      Chief Complaint  Patient presents with  . Urinary Tract Infection    lower back pain    (Consider location/radiation/quality/duration/timing/severity/associated sxs/prior treatment) HPI Comments: Patient presents to urgent care complaining of back pain and stomach pain (points to suprapubic area and lower lumbar region), pain gets much worse when she urinates and she is perceiving blood, burning and pressure and increase frequency every time she urinates. She has had the symptoms for about 6-7 days. She denies any fevers, vomiting, and have not been taking any specific medications for it. Patient describes that she has had many of this infections before and she has had to take antibiotics for. She also describes that she has seen a kidney doctor because of this infections.  Patient is a 41 y.o. female presenting with urinary tract infection. The history is provided by the spouse and the patient.  Urinary Tract Infection This is a new problem. The current episode started more than 1 week ago. The problem occurs constantly. The problem has not changed since onset.Associated symptoms include abdominal pain. Pertinent negatives include no shortness of breath. Exacerbated by: Urination.    Past Medical History  Diagnosis Date  . Arthritis   . Bulging discs   . Bladder infection   . Chronic UTI   . Pyelonephritis   . Uterine polyp   . Female bladder prolapse   . Yeast infection of the vagina   . Menorrhagia     Past Surgical History  Procedure Date  . Cholecystectomy   . Bladder surgery   . Cervical polypectomy   . Gynecologic cryosurgery   . Wrist surgery     Carpal Tunnel surgery  . Carpal tunnel release     Family History  Problem Relation Age of Onset  . Diabetes Father   . Hypertension Father   . Heart attack Neg Hx   . Hyperlipidemia Neg Hx       History  Substance Use Topics  . Smoking status: Never Smoker   . Smokeless tobacco: Current User    Types: Snuff  . Alcohol Use: No    OB History    Grav Para Term Preterm Abortions TAB SAB Ect Mult Living   3 3 2 1      3       Review of Systems  Constitutional: Negative for fever, chills, activity change and unexpected weight change.  Respiratory: Negative for shortness of breath.   Gastrointestinal: Positive for abdominal pain. Negative for nausea, vomiting, diarrhea and abdominal distention.  Genitourinary: Positive for dysuria, urgency, hematuria and pelvic pain. Negative for flank pain, decreased urine volume, vaginal bleeding, vaginal discharge, genital sores and vaginal pain.    Allergies  Aspirin and Hydrocodone  Home Medications   Current Outpatient Rx  Name Route Sig Dispense Refill  . PHENAZOPYRIDINE HCL 200 MG PO TABS Oral Take 1 tablet (200 mg total) by mouth 3 (three) times daily as needed for pain. 6 tablet 0  . PROMETHAZINE HCL 25 MG PO TABS Oral Take 1 tablet (25 mg total) by mouth every 6 (six) hours as needed for nausea. 20 tablet 0  . SULFAMETHOXAZOLE-TRIMETHOPRIM 800-160 MG PO TABS Oral Take 1 tablet by mouth 2 (two) times daily. 14 tablet 0    BP 120/83  Pulse 83  Temp 98.6 F (37 C) (Oral)  Resp  20  SpO2 100%  Physical Exam  Nursing note and vitals reviewed. Constitutional: She appears well-developed and well-nourished.  Pulmonary/Chest: Effort normal and breath sounds normal.  Abdominal: Soft. She exhibits no distension and no mass. There is tenderness. There is no rigidity, no rebound, no guarding, no CVA tenderness, no tenderness at McBurney's point and negative Murphy's sign.    Musculoskeletal:       Back:    ED Course  Procedures (including critical care time)  Labs Reviewed  POCT URINALYSIS DIP (DEVICE) - Abnormal; Notable for the following:    Hgb urine dipstick LARGE (*)     Nitrite POSITIVE (*)     Leukocytes, UA SMALL  (*)  Biochemical Testing Only. Please order routine urinalysis from main lab if confirmatory testing is needed.   All other components within normal limits  POCT PREGNANCY, URINE  URINE CULTURE   No results found.   1. Urinary tract infection   2. Cystitis       MDM  Patient with dysphoria lower back pain, for a week. Patient with recurrent urinary tract infections with confirm Escherichia coli. Today we have repeated cultures and encourage her to complete a Bactrim course as patient previous culture results sensitivity seemed to be high just with sulfas. Patient sees a kidney doctor ( 4 urinary problems she states). Have encouraged her to start with antibiotics today and if any symptoms such as fevers, vomiting or increased abdominal pain was instructed to present herself to the emergency department for further evaluation. Both herself and her husband were present during exam and discussion and were agreeable to this treatment plan and also understands that if worsening symptoms they would attend the emergency department.        Jimmie Molly, MD 11/17/11 1309

## 2011-11-17 NOTE — ED Notes (Signed)
Pt c/o lower back pain/stomach pain and urgency and frequency with urinating. Pt states that she has noticed blood in her urine. Pt denies n/v. Symptoms have been present for the past week.

## 2011-11-19 ENCOUNTER — Emergency Department (HOSPITAL_COMMUNITY)
Admission: EM | Admit: 2011-11-19 | Discharge: 2011-11-19 | Disposition: A | Discharge status: Home / Self Care | Payer: Medicaid Other | Attending: Emergency Medicine | Admitting: Emergency Medicine

## 2011-11-19 ENCOUNTER — Encounter (HOSPITAL_COMMUNITY): Payer: Self-pay | Admitting: Emergency Medicine

## 2011-11-19 DIAGNOSIS — N390 Urinary tract infection, site not specified: Secondary | ICD-10-CM | POA: Insufficient documentation

## 2011-11-19 LAB — URINALYSIS, ROUTINE W REFLEX MICROSCOPIC
Glucose, UA: NEGATIVE mg/dL
Specific Gravity, Urine: 1.018 (ref 1.005–1.030)
Urobilinogen, UA: 0.2 mg/dL (ref 0.0–1.0)
pH: 5 (ref 5.0–8.0)

## 2011-11-19 LAB — URINE CULTURE: Colony Count: >=100000

## 2011-11-19 LAB — URINE MICROSCOPIC-ADD ON

## 2011-11-19 NOTE — ED Notes (Signed)
Pt seen at [facility] for UTI and given bactrim but sts makes her sick and can not take it; pt sts still having pain

## 2011-11-20 NOTE — ED Notes (Signed)
Urine culture positive for ESCHERICHIA COLI.  Adequately treated at visit with Septra-DS per sensitivity report.

## 2012-02-12 ENCOUNTER — Ambulatory Visit: Payer: Medicaid Other | Admitting: Family Medicine

## 2012-02-13 ENCOUNTER — Ambulatory Visit: Payer: Medicaid Other | Admitting: Family Medicine

## 2012-02-24 ENCOUNTER — Encounter: Payer: Self-pay | Admitting: Family Medicine

## 2012-02-24 ENCOUNTER — Ambulatory Visit (INDEPENDENT_AMBULATORY_CARE_PROVIDER_SITE_OTHER): Payer: Medicaid Other | Admitting: Family Medicine

## 2012-02-24 VITALS — BP 117/83 | HR 101 | Ht 63.0 in | Wt 243.0 lb

## 2012-02-24 DIAGNOSIS — M25519 Pain in unspecified shoulder: Secondary | ICD-10-CM

## 2012-02-24 DIAGNOSIS — M25511 Pain in right shoulder: Secondary | ICD-10-CM

## 2012-02-24 MED ORDER — DIAZEPAM 5 MG PO TABS: ORAL_TABLET | ORAL | Status: DC

## 2012-02-25 ENCOUNTER — Encounter: Payer: Self-pay | Admitting: Family Medicine

## 2012-02-25 NOTE — Progress Notes (Addendum)
Subjective:    Patient ID: Cathy Rowland, female    DOB: [birth year], 41 y.o.   MRN: [id number]  PCP: Dr. Larina Bras  Shoulder Pain    41 yo F here for f/u right shoulder pain.  5/24: 1. Right shoulder pain Patient reports no known injury. States a month ago started developing right anterior/superior shoulder pain. Pain has intensified since this time and now radiates into right elbow and up back/neck. Worse with overhead, reaching activities. Occasionally taking ibuprofen and using heating pad. Is right handed. Noticed this initially when she woke up. Some burning quality to the pain in same distrubution also. No bowel/bladder dysfunction.  2. Right hand/wrist numbness Known left sided carpal tunnel - was severe so 2 months ago underwent carpal tunnel release. This side is improved but right side still giving intermittent problems. Numbness into hand mostly on radial side. Worse in morning. Not using a brace. Occasional ibuprofen.  7/23: Patient reports prednisone helped quite a bit with her right shoulder pain but returned after a few weeks. Pain radiates from right posterior and lateral shoulder past elbow into hand at times. Also has known carpal tunnel on this side - wears brace. States unable to get comfortable, + night pain. Taking mobic. Worse lying on right shoulder. Describes burning along with the pain. Percocet helped some. Lack of insurance a major barrier to her care.  9/5: Patient states injection caused relief of her right shoulder pain for 2 weeks but it started to come back and is again severe. Taking tramadol for pain. Unable to get comfortable. Pain worse with all shoulder motions. + night pain. Radiates from outside right shoulder to elbow.  12/30: Patient reports she had some improvement in right shoulder pain again with injection. Pain has worsened slowly over past 2-3 months though. Now unable to get comfortable again. Pain is lateral shoulder,  sometimes to elbow. Tramadol for pain but not working. + night pain.  Past Medical History  Diagnosis Date  . Arthritis   . Bulging discs   . Bladder infection   . Chronic UTI   . Pyelonephritis   . Uterine polyp   . Female bladder prolapse   . Yeast infection of the vagina   . Menorrhagia     Current Outpatient Prescriptions on File Prior to Visit  Medication Sig Dispense Refill  . promethazine (PHENERGAN) 25 MG tablet Take 1 tablet (25 mg total) by mouth every 6 (six) hours as needed for nausea.  20 tablet  0    Past Surgical History  Procedure Date  . Cholecystectomy   . Bladder surgery   . Cervical polypectomy   . Gynecologic cryosurgery   . Wrist surgery     Carpal Tunnel surgery  . Carpal tunnel release     Allergies  Allergen Reactions  . Aspirin Other (See Comments)    Upsets ulcer  . Hydrocodone Other (See Comments)    Fidgety and jittery  . Septra (Sulfamethoxazole W-Trimethoprim) Nausea And Vomiting    History   Social History  . Marital Status: Married    Spouse Name: N/A    Number of Children: N/A  . Years of Education: N/A   Occupational History  . Not on file.   Social History Main Topics  . Smoking status: Never Smoker   . Smokeless tobacco: Current User    Types: Snuff  . Alcohol Use: No  . Drug Use: No  . Sexually Active: Yes    Birth Control/ Protection: Implant  Other Topics Concern  . Not on file   Social History Narrative  . No narrative on file    Family History  Problem Relation Age of Onset  . Diabetes Father   . Hypertension Father   . Heart attack Neg Hx   . Hyperlipidemia Neg Hx     BP 117/83  Pulse 101  Ht 5\' 3"  (1.6 m)  Wt 243 lb (110.224 kg)  BMI 43.05 kg/m2  Review of Systems  See HPI above.    Objective:   Physical Exam  Gen: NAD  R shoulder: No swelling, ecchymoses.  No gross deformity. No TTP at biceps tendon and AC joint.  TTP in trapezius, lateral shoulder. FROM with + painful  arc. Positive Hawkins and Neers. Negative Yergasons. Strength 5-/5 with empty can.  5/5 with empty can and resisted internal/external rotation - pain with empty can. NV intact distally.  L shoulder: FROM without pain or weakness.    Assessment & Plan:  1. Right shoulder pain - Again, pain improved for short period with second injection but not long lasting and pain severe again.  Has done HEP for several months, tried cortisone subacromial injections x 2, ibuprofen, relative rest and still with severe right shoulder pain.  Some mild weakness of supraspinatus but may be pain related.  Will move forward at this time with MRI right shoulder to assess for rotator cuff tear.    Addendum 1/6:  MRI reviewed and discussed with patient.  Some motion artifact as well.  Confirmed to have supraspinatus tendinopathy and bursitis.  Also with a possible small supraspinatus tear but not full thickness.  A couple findings that can be consistent with adhesive capsulitis but she's been able to passively get full motion but this is painful.  Given she has gone through > 6 months with this, not responsive to conservative therapy advised we refer to ortho for consideration of arthroscopy.

## 2012-02-25 NOTE — Assessment & Plan Note (Signed)
Again, pain improved for short period with second injection but not long lasting and pain severe again.  Has done HEP for several months, tried cortisone subacromial injections x 2, ibuprofen, relative rest and still with severe right shoulder pain.  Some mild weakness of supraspinatus but may be pain related.  Will move forward at this time with MRI right shoulder to assess for rotator cuff tear.

## 2012-02-29 ENCOUNTER — Ambulatory Visit (HOSPITAL_BASED_OUTPATIENT_CLINIC_OR_DEPARTMENT_OTHER)
Admission: RE | Admit: 2012-02-29 | Discharge: 2012-02-29 | Disposition: A | Discharge status: Home / Self Care | Payer: Medicaid Other | Source: Ambulatory Visit | Attending: Family Medicine | Admitting: Family Medicine

## 2012-02-29 ENCOUNTER — Ambulatory Visit (HOSPITAL_BASED_OUTPATIENT_CLINIC_OR_DEPARTMENT_OTHER): Payer: Medicaid Other

## 2012-02-29 DIAGNOSIS — M67919 Unspecified disorder of synovium and tendon, unspecified shoulder: Secondary | ICD-10-CM | POA: Insufficient documentation

## 2012-02-29 DIAGNOSIS — M719 Bursopathy, unspecified: Secondary | ICD-10-CM | POA: Insufficient documentation

## 2012-02-29 DIAGNOSIS — M25511 Pain in right shoulder: Secondary | ICD-10-CM

## 2012-03-03 NOTE — Addendum Note (Signed)
Addended by: Lenda Kelp on: 03/03/2012 08:45 AM   Modules accepted: Orders

## 2012-06-24 ENCOUNTER — Encounter (HOSPITAL_BASED_OUTPATIENT_CLINIC_OR_DEPARTMENT_OTHER): Payer: Self-pay | Admitting: *Deleted

## 2012-06-24 ENCOUNTER — Emergency Department (HOSPITAL_BASED_OUTPATIENT_CLINIC_OR_DEPARTMENT_OTHER): Payer: Medicaid Other

## 2012-06-24 ENCOUNTER — Emergency Department (HOSPITAL_BASED_OUTPATIENT_CLINIC_OR_DEPARTMENT_OTHER)
Admission: EM | Admit: 2012-06-24 | Discharge: 2012-06-24 | Disposition: A | Discharge status: Home / Self Care | Payer: Medicaid Other | Attending: Emergency Medicine | Admitting: Emergency Medicine

## 2012-06-24 DIAGNOSIS — Z8742 Personal history of other diseases of the female genital tract: Secondary | ICD-10-CM | POA: Insufficient documentation

## 2012-06-24 DIAGNOSIS — Z3202 Encounter for pregnancy test, result negative: Secondary | ICD-10-CM | POA: Insufficient documentation

## 2012-06-24 DIAGNOSIS — Z9089 Acquired absence of other organs: Secondary | ICD-10-CM | POA: Insufficient documentation

## 2012-06-24 DIAGNOSIS — N390 Urinary tract infection, site not specified: Secondary | ICD-10-CM | POA: Insufficient documentation

## 2012-06-24 DIAGNOSIS — R319 Hematuria, unspecified: Secondary | ICD-10-CM | POA: Insufficient documentation

## 2012-06-24 DIAGNOSIS — Z8739 Personal history of other diseases of the musculoskeletal system and connective tissue: Secondary | ICD-10-CM | POA: Insufficient documentation

## 2012-06-24 DIAGNOSIS — Z8619 Personal history of other infectious and parasitic diseases: Secondary | ICD-10-CM | POA: Insufficient documentation

## 2012-06-24 DIAGNOSIS — N393 Stress incontinence (female) (male): Secondary | ICD-10-CM | POA: Insufficient documentation

## 2012-06-24 DIAGNOSIS — Z87448 Personal history of other diseases of urinary system: Secondary | ICD-10-CM | POA: Insufficient documentation

## 2012-06-24 DIAGNOSIS — Z792 Long term (current) use of antibiotics: Secondary | ICD-10-CM | POA: Insufficient documentation

## 2012-06-24 DIAGNOSIS — R109 Unspecified abdominal pain: Secondary | ICD-10-CM | POA: Insufficient documentation

## 2012-06-24 DIAGNOSIS — E669 Obesity, unspecified: Secondary | ICD-10-CM | POA: Insufficient documentation

## 2012-06-24 LAB — URINALYSIS, ROUTINE W REFLEX MICROSCOPIC
Bilirubin Urine: NEGATIVE
Ketones, ur: NEGATIVE mg/dL
Nitrite: NEGATIVE
Protein, ur: NEGATIVE mg/dL
pH: 5.5 (ref 5.0–8.0)

## 2012-06-24 LAB — CBC WITH DIFFERENTIAL/PLATELET
Basophils Absolute: 0 10*3/uL (ref 0.0–0.1)
Basophils Relative: 0 % (ref 0–1)
Hemoglobin: 13.7 g/dL (ref 12.0–15.0)
MCHC: 33.5 g/dL (ref 30.0–36.0)
Neutro Abs: 4.4 10*3/uL (ref 1.7–7.7)
Neutrophils Relative %: 51 % (ref 43–77)
RDW: 13.1 % (ref 11.5–15.5)

## 2012-06-24 LAB — COMPREHENSIVE METABOLIC PANEL
Alkaline Phosphatase: 55 U/L (ref 39–117)
BUN: 13 mg/dL (ref 6–23)
GFR calc Af Amer: 89 mL/min — ABNORMAL LOW (ref >90)
Glucose, Bld: 101 mg/dL — ABNORMAL HIGH (ref 70–99)
Potassium: 3.8 mEq/L (ref 3.5–5.1)
Total Bilirubin: 0.4 mg/dL (ref 0.3–1.2)
Total Protein: 7 g/dL (ref 6.0–8.3)

## 2012-06-24 LAB — LIPASE, BLOOD: Lipase: 29 U/L (ref 11–59)

## 2012-06-24 LAB — WET PREP, GENITAL: Yeast Wet Prep HPF POC: NONE SEEN

## 2012-06-24 MED ORDER — ACETAMINOPHEN-CODEINE #2 300-15 MG PO TABS: 1.0000 | ORAL_TABLET | ORAL | Status: DC | PRN

## 2012-06-24 MED ORDER — ONDANSETRON HCL 4 MG/2ML IJ SOLN
4.0000 mg | Freq: Once | INTRAMUSCULAR | Status: AC
  Administered 2012-06-24: 4 mg via INTRAVENOUS
  Filled 2012-06-24: qty 2

## 2012-06-24 MED ORDER — SODIUM CHLORIDE 0.9 % IV SOLN
1000.0000 mL | INTRAVENOUS | Status: DC
  Administered 2012-06-24: 1000 mL via INTRAVENOUS

## 2012-06-24 MED ORDER — SODIUM CHLORIDE 0.9 % IV BOLUS (SEPSIS)
1000.0000 mL | Freq: Once | INTRAVENOUS | Status: AC
  Administered 2012-06-24: 1000 mL via INTRAVENOUS

## 2012-06-24 MED ORDER — SODIUM CHLORIDE 0.9 % IV SOLN: Freq: Once | INTRAVENOUS | Status: DC

## 2012-06-24 MED ORDER — IOHEXOL 300 MG/ML  SOLN
50.0000 mL | Freq: Once | INTRAMUSCULAR | Status: AC | PRN
  Administered 2012-06-24: 50 mL via ORAL

## 2012-06-24 MED ORDER — MORPHINE SULFATE 4 MG/ML IJ SOLN
4.0000 mg | Freq: Once | INTRAMUSCULAR | Status: AC
  Administered 2012-06-24: 4 mg via INTRAVENOUS
  Filled 2012-06-24: qty 1

## 2012-06-24 MED ORDER — IOHEXOL 300 MG/ML  SOLN
100.0000 mL | Freq: Once | INTRAMUSCULAR | Status: AC | PRN
  Administered 2012-06-24: 100 mL via INTRAVENOUS

## 2012-06-24 NOTE — ED Provider Notes (Signed)
History     CSN: [id number]  Arrival date & time 06/24/12  2015   None     Chief Complaint  Patient presents with  . Abdominal Pain  . Hematuria    (Consider location/radiation/quality/duration/timing/severity/associated sxs/prior treatment) HPI Comments: Patient has been having pain in the right flank and blood in the urine since last week. She is scheduled to have rotator cuff surgery, lose cancel because her urine was abnormal. The orthopedist prescribed her Cipro to take. She has taken that for 2 days. She continues to have right lower abdominal pain, and therefore her family doctor advised her to go to the ED for evaluation. She has had frequent urinary infections, and has stress urinary incontinence. She's had prior cholecystectomy.  Patient is a 42 y.o. female presenting with abdominal pain and hematuria. The history is provided by the patient and the spouse. No language interpreter was used.  Abdominal Pain Pain location:  RLQ Pain radiates to:  Does not radiate Pain severity:  Moderate Onset quality:  Gradual Duration:  1 week Timing:  Intermittent Progression:  Worsening Chronicity:  New Relieved by:  Nothing Worsened by:  Nothing tried Ineffective treatments: Treated with Cipro without relief. Associated symptoms: hematuria   Associated symptoms: no chills and no fever   Risk factors: obesity   Hematuria Associated symptoms include abdominal pain.    Past Medical History  Diagnosis Date  . Arthritis   . Bulging discs   . Bladder infection   . Chronic UTI   . Pyelonephritis   . Uterine polyp   . Female bladder prolapse   . Yeast infection of the vagina   . Menorrhagia     Past Surgical History  Procedure Laterality Date  . Cholecystectomy    . Bladder surgery    . Cervical polypectomy    . Gynecologic cryosurgery    . Wrist surgery      Carpal Tunnel surgery  . Carpal tunnel release      Family History  Problem Relation Age of Onset  .  Diabetes Father   . Hypertension Father   . Heart attack Neg Hx   . Hyperlipidemia Neg Hx     History  Substance Use Topics  . Smoking status: Never Smoker   . Smokeless tobacco: Current User    Types: Snuff  . Alcohol Use: No    OB History   Grav Para Term Preterm Abortions TAB SAB Ect Mult Living   3 3 2 1      3       Review of Systems  Constitutional: Negative for fever and chills.  HENT: Negative.        She dips snuff.  Eyes: Negative.   Respiratory: Negative.   Cardiovascular: Negative.   Gastrointestinal: Positive for abdominal pain.  Genitourinary: Positive for hematuria and flank pain.  Musculoskeletal: Negative.   Skin: Negative.   Neurological: Negative.     Allergies  Aspirin; Hydrocodone; and Septra  Home Medications   Current Outpatient Rx  Name  Route  Sig  Dispense  Refill  . ciprofloxacin (CIPRO) 500 MG tablet   Oral   Take 500 mg by mouth 2 (two) times daily.         . diazepam (VALIUM) 5 MG tablet      Take 1 tab po 30 minutes prior to procedure for anxiety.  Can repeat x1 if necessary.   2 tablet   0   . EXPIRED: promethazine (PHENERGAN) 25 MG tablet  Oral   Take 1 tablet (25 mg total) by mouth every 6 (six) hours as needed for nausea.   20 tablet   0     BP 124/81  Pulse 85  Temp(Src) 98.5 F (36.9 C) (Oral)  Resp 16  Ht 5\' 3"  (1.6 m)  Wt 250 lb (113.399 kg)  BMI 44.3 kg/m2  SpO2 98%  Physical Exam  Nursing note and vitals reviewed. Constitutional: She is oriented to person, place, and time. She appears well-developed and well-nourished. Distressed: mild distress with right lower quadrant abdominal pain.  HENT:  Head: Normocephalic and atraumatic.  Right Ear: External ear normal.  Left Ear: External ear normal.  Mouth/Throat: Oropharynx is clear and moist.  Eyes: Conjunctivae and EOM are normal. Pupils are equal, round, and reactive to light. No scleral icterus.  Neck: Normal range of motion. Neck supple.   Cardiovascular: Normal rate, regular rhythm and normal heart sounds.   Pulmonary/Chest: Effort normal and breath sounds normal.  Abdominal: Soft.  He localizes pain to the right lower abdomen. There is no point tenderness, no rebound or rigidity.  Genitourinary:  Normal female external genitalia.  Has some bladder prolapse.  Cervix appears  Musculoskeletal: Normal range of motion. She exhibits no edema and no tenderness.  Lymphadenopathy:    She has no cervical adenopathy.  Neurological: She is alert and oriented to person, place, and time.  No sensory or motor deficit.  Skin: Skin is warm and dry.  Psychiatric: She has a normal mood and affect. Her behavior is normal.    ED Course  Procedures (including critical care time)  Labs Reviewed  GC/CHLAMYDIA PROBE AMP  WET PREP, GENITAL  URINALYSIS, ROUTINE W REFLEX MICROSCOPIC  PREGNANCY, URINE  COMPREHENSIVE METABOLIC PANEL  LIPASE, BLOOD  CBC WITH DIFFERENTIAL   9:03 PM Patient was seen and had physical exam. Laboratory tests CT of the abdomen were ordered. Pelvic exam cervix was ordered.  10:55 PM Results for orders placed during the hospital encounter of 06/24/12  WET PREP, GENITAL      Result Value Range   Yeast Wet Prep HPF POC NONE SEEN  NONE SEEN   Trich, Wet Prep NONE SEEN  NONE SEEN   Clue Cells Wet Prep HPF POC NONE SEEN  NONE SEEN   WBC, Wet Prep HPF POC NONE SEEN  NONE SEEN  URINALYSIS, ROUTINE W REFLEX MICROSCOPIC      Result Value Range   Color, Urine YELLOW  YELLOW   APPearance CLEAR  CLEAR   Specific Gravity, Urine 1.025  1.005 - 1.030   pH 5.5  5.0 - 8.0   Glucose, UA NEGATIVE  NEGATIVE mg/dL   Hgb urine dipstick NEGATIVE  NEGATIVE   Bilirubin Urine NEGATIVE  NEGATIVE   Ketones, ur NEGATIVE  NEGATIVE mg/dL   Protein, ur NEGATIVE  NEGATIVE mg/dL   Urobilinogen, UA 1.0  0.0 - 1.0 mg/dL   Nitrite NEGATIVE  NEGATIVE   Leukocytes, UA NEGATIVE  NEGATIVE  PREGNANCY, URINE      Result Value Range   Preg  Test, Ur NEGATIVE  NEGATIVE  COMPREHENSIVE METABOLIC PANEL      Result Value Range   Sodium 148 (*) 135 - 145 mEq/L   Potassium 3.8  3.5 - 5.1 mEq/L   Chloride 110  96 - 112 mEq/L   CO2 25  19 - 32 mEq/L   Glucose, Bld 101 (*) 70 - 99 mg/dL   BUN 13  6 - 23 mg/dL   Creatinine,  Ser 0.90  0.50 - 1.10 mg/dL   Calcium 9.5  8.4 - 16.1 mg/dL   Total Protein 7.0  6.0 - 8.3 g/dL   Albumin 3.8  3.5 - 5.2 g/dL   AST 23  0 - 37 U/L   ALT 18  0 - 35 U/L   Alkaline Phosphatase 55  39 - 117 U/L   Total Bilirubin 0.4  0.3 - 1.2 mg/dL   GFR calc non Af Amer 77 (*) >90 mL/min   GFR calc Af Amer 89 (*) >90 mL/min  LIPASE, BLOOD      Result Value Range   Lipase 29  11 - 59 U/L  CBC WITH DIFFERENTIAL      Result Value Range   WBC 8.5  4.0 - 10.5 K/uL   RBC 4.65  3.87 - 5.11 MIL/uL   Hemoglobin 13.7  12.0 - 15.0 g/dL   HCT 09.6  04.5 - 40.9 %   MCV 88.0  78.0 - 100.0 fL   MCH 29.5  26.0 - 34.0 pg   MCHC 33.5  30.0 - 36.0 g/dL   RDW 81.1  91.4 - 78.2 %   Platelets 236  150 - 400 K/uL   Neutrophils Relative 51  43 - 77 %   Neutro Abs 4.4  1.7 - 7.7 K/uL   Lymphocytes Relative 38  12 - 46 %   Lymphs Abs 3.2  0.7 - 4.0 K/uL   Monocytes Relative 9  3 - 12 %   Monocytes Absolute 0.8  0.1 - 1.0 K/uL   Eosinophils Relative 1  0 - 5 %   Eosinophils Absolute 0.1  0.0 - 0.7 K/uL   Basophils Relative 0  0 - 1 %   Basophils Absolute 0.0  0.0 - 0.1 K/uL   Ct Abdomen Pelvis W Contrast  06/24/2012  *RADIOLOGY REPORT*  Clinical Data: Abdominal pain.  Right lower quadrant pain.  Nausea. Hematuria.  CT ABDOMEN AND PELVIS WITH CONTRAST  Technique:  Multidetector CT imaging of the abdomen and pelvis was performed following the standard protocol during bolus administration of intravenous contrast.  Contrast: 50mL OMNIPAQUE IOHEXOL 300 MG/ML  SOLN, OMNIPAQUE IOHEXOL 300 MG/ML  SOLN  Comparison: 03/26/2010.  Findings: Lung Bases: Clear.  Liver:  Heterogeneous attenuation of the liver compatible with geographic  fatty infiltration.  Old granulomatous disease in the right hepatic lobe.  Spleen:  Normal.  Gallbladder:  Cholecystectomy.  Common bile duct:  Normal.  Pancreas:  Normal.  Adrenal glands:  Normal.  Kidneys:  Bilateral renal sinus cysts.  These appear similar to the prior examination.  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  Right renal atrophy relative to the left compatible with renal parenchymal scarring.  Scarring is most prominent in the right upper renal pole.  There is normal delayed excretion of contrast bilaterally.  Both ureters appear within normal limits. No calculi.  Stomach:  Normal.  Small bowel:  Normal.  No mesenteric adenopathy.  Colon:   Normal appendix.  Colon appears within normal limits. Scattered sparse colonic diverticula.  No diverticulitis.  Pelvic Genitourinary:  Normal. Tiny locule of gas in the urinary bladder is likely iatrogenic/instrumentation.  Correlate for recent Foley catheterization.  Bones:  No aggressive osseous lesions.  L5-S1 degenerative disc disease with Schmorl's node.  Vasculature: Normal.  IMPRESSION:  1.  Tiny locule of gas in the urinary bladder probably relates to recent catheterization. 2.  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 3.  Cholecystectomy. 4.  Heterogeneous low attenuation of the liver compatible with geographic hepatosteatosis. 5.  Negative for appendicitis.   Original Report Authenticated By: Andreas Newport, M.D.    Lab workup was entirely normal.  She has stress urinary incontinence, and should be evaluated to see if she needs surgery for this either by an OBGYN or a Urologist.  Advised to finish out her prescription for Cipro.  RxTC3 q4h prn pain.     1. Abdominal pain   2. SUI (stress urinary incontinence, female)            Carleene Cooper III, MD 06/25/12 [phone]

## 2012-06-24 NOTE — ED Notes (Signed)
Pt ambulatory to bathroom

## 2012-06-24 NOTE — ED Notes (Signed)
Pt reports right lower right abd pain with blood in urine x 1 week seen by PMD DX uti on cipro

## 2012-06-26 LAB — GC/CHLAMYDIA PROBE AMP
CT Probe RNA: NEGATIVE
GC Probe RNA: NEGATIVE

## 2012-07-02 ENCOUNTER — Telehealth (HOSPITAL_COMMUNITY): Payer: Self-pay | Admitting: Emergency Medicine

## 2012-07-02 NOTE — ED Notes (Signed)
Chart left with M. Sciacca PA to review. She states that she will call when review is complete.

## 2013-04-13 ENCOUNTER — Emergency Department (HOSPITAL_BASED_OUTPATIENT_CLINIC_OR_DEPARTMENT_OTHER)
Admission: EM | Admit: 2013-04-13 | Discharge: 2013-04-13 | Disposition: A | Discharge status: Home / Self Care | Payer: Medicaid Other | Attending: Emergency Medicine | Admitting: Emergency Medicine

## 2013-04-13 ENCOUNTER — Encounter (HOSPITAL_BASED_OUTPATIENT_CLINIC_OR_DEPARTMENT_OTHER): Payer: Self-pay | Admitting: Emergency Medicine

## 2013-04-13 DIAGNOSIS — Z79899 Other long term (current) drug therapy: Secondary | ICD-10-CM | POA: Insufficient documentation

## 2013-04-13 DIAGNOSIS — G8929 Other chronic pain: Secondary | ICD-10-CM

## 2013-04-13 DIAGNOSIS — Z8619 Personal history of other infectious and parasitic diseases: Secondary | ICD-10-CM | POA: Insufficient documentation

## 2013-04-13 DIAGNOSIS — M549 Dorsalgia, unspecified: Secondary | ICD-10-CM

## 2013-04-13 DIAGNOSIS — H5789 Other specified disorders of eye and adnexa: Secondary | ICD-10-CM | POA: Insufficient documentation

## 2013-04-13 DIAGNOSIS — Z87440 Personal history of urinary (tract) infections: Secondary | ICD-10-CM | POA: Insufficient documentation

## 2013-04-13 DIAGNOSIS — M5430 Sciatica, unspecified side: Secondary | ICD-10-CM | POA: Insufficient documentation

## 2013-04-13 DIAGNOSIS — Z87448 Personal history of other diseases of urinary system: Secondary | ICD-10-CM | POA: Insufficient documentation

## 2013-04-13 DIAGNOSIS — M5431 Sciatica, right side: Secondary | ICD-10-CM

## 2013-04-13 DIAGNOSIS — Z792 Long term (current) use of antibiotics: Secondary | ICD-10-CM | POA: Insufficient documentation

## 2013-04-13 DIAGNOSIS — M129 Arthropathy, unspecified: Secondary | ICD-10-CM | POA: Insufficient documentation

## 2013-04-13 DIAGNOSIS — Z8742 Personal history of other diseases of the female genital tract: Secondary | ICD-10-CM | POA: Insufficient documentation

## 2013-04-13 MED ORDER — HYDROMORPHONE HCL PF 2 MG/ML IJ SOLN
2.0000 mg | Freq: Once | INTRAMUSCULAR | Status: AC
  Administered 2013-04-13: 2 mg via INTRAMUSCULAR
  Filled 2013-04-13: qty 1

## 2013-04-13 NOTE — ED Notes (Signed)
Pt amb to room 6 with slow, steady gait in nad. Pt reports exacerbation of her usual low back pain radiating down her right leg x 4 days.

## 2013-04-13 NOTE — ED Provider Notes (Signed)
CSN: [id number]     Arrival date & time 04/13/13  1041 History   First MD Initiated Contact with Patient 04/13/13 1214     Chief Complaint  Patient presents with  . chronic back pain      (Consider location/radiation/quality/duration/timing/severity/associated sxs/prior Treatment) The history is provided by the patient.   42 units per old female history of chronic back pain. Followed by pain management. Onset of exacerbation of right lower lumbar back pain 4 days ago radiating into right leg. Always down to the foot no significant weakness or numbness. No incontinence. Patient's pain medicine at home is not handling the pain. No fall or injury. Pain is 10 out of 10. Described as sharp and an ache.  Past Medical History  Diagnosis Date  . Arthritis   . Bulging discs   . Bladder infection   . Chronic UTI   . Pyelonephritis   . Uterine polyp   . Female bladder prolapse   . Yeast infection of the vagina   . Menorrhagia    Past Surgical History  Procedure Laterality Date  . Cholecystectomy    . Bladder surgery    . Cervical polypectomy    . Gynecologic cryosurgery    . Wrist surgery      Carpal Tunnel surgery  . Carpal tunnel release     Family History  Problem Relation Age of Onset  . Diabetes Father   . Hypertension Father   . Heart attack Neg Hx   . Hyperlipidemia Neg Hx    History  Substance Use Topics  . Smoking status: Never Smoker   . Smokeless tobacco: Current User    Types: Snuff  . Alcohol Use: No   OB History   Grav Para Term Preterm Abortions TAB SAB Ect Mult Living   3 3 2 1      3      Review of Systems  Constitutional: Negative for fever.  HENT: Negative for congestion.   Eyes: Positive for redness.  Respiratory: Negative for shortness of breath.   Cardiovascular: Negative for chest pain.  Gastrointestinal: Negative for abdominal pain.  Genitourinary: Negative for dysuria.  Musculoskeletal: Positive for back pain. Negative for neck pain.  Skin:  Negative for rash.  Neurological: Negative for headaches.  Hematological: Does not bruise/bleed easily.  Psychiatric/Behavioral: Negative for confusion.      Allergies  Aspirin; Hydrocodone; and Septra  Home Medications   Current Outpatient Rx  Name  Route  Sig  Dispense  Refill  . nitrofurantoin, macrocrystal-monohydrate, (MACROBID) 100 MG capsule   Oral   Take 100 mg by mouth 2 (two) times daily.         Marland Kitchen acetaminophen-codeine (TYLENOL #2) 300-15 MG per tablet   Oral   Take 1 tablet by mouth every 4 (four) hours as needed for pain.   30 tablet   0   . ciprofloxacin (CIPRO) 500 MG tablet   Oral   Take 500 mg by mouth 2 (two) times daily.         . diazepam (VALIUM) 5 MG tablet      Take 1 tab po 30 minutes prior to procedure for anxiety.  Can repeat x1 if necessary.   2 tablet   0   . EXPIRED: promethazine (PHENERGAN) 25 MG tablet   Oral   Take 1 tablet (25 mg total) by mouth every 6 (six) hours as needed for nausea.   20 tablet   0    BP 128/72  Pulse 89  Temp(Src) 98.2 F (36.8 C)  Resp 18  Ht 5\' 3"  (1.6 m)  Wt 254 lb (115.214 kg)  BMI 45.01 kg/m2  SpO2 98% Physical Exam  Nursing note and vitals reviewed. Constitutional: She is oriented to person, place, and time. She appears well-developed and well-nourished. No distress.  HENT:  Head: Normocephalic and atraumatic.  Mouth/Throat: Oropharynx is clear and moist.  Eyes: Conjunctivae and EOM are normal. Pupils are equal, round, and reactive to light.  Neck: Normal range of motion. Neck supple.  Cardiovascular: Normal rate and normal heart sounds.   No murmur heard. Pulmonary/Chest: Effort normal and breath sounds normal.  Abdominal: Soft. Bowel sounds are normal. There is no tenderness.  Musculoskeletal: Normal range of motion. She exhibits tenderness. She exhibits no edema.  Tenderness to palpation the right paraspinous area.  Neurological: She is alert and oriented to person, place, and time. No  cranial nerve deficit. She exhibits normal muscle tone. Coordination normal.  Skin: Skin is warm. No rash noted.    ED Course  Procedures (including critical care time) Labs Review Labs Reviewed - No data to display Imaging Review No results found.  EKG Interpretation   None       MDM   Final diagnoses:  Chronic back pain  Sciatica of right side    Patient with history of chronic back pain. Followed by pain management. Patient with an exacerbation of low back pain on the right side starting about 4 days ago with pain radiating down into the right leg and into the foot consistent with a right sciatica. No significant weakness or numbness. Patient given hydromorphone here IM full contact her pain management doctor for further pain management. She does have pain medications at home. If symptoms persist discussed that getting an MRI would be appropriate.    Shelda JakesScott W. Vashon Arch, MD 04/13/13 [phone]

## 2013-04-13 NOTE — Discharge Instructions (Signed)
Resting continue current current pain medicine. Call your pain management Dr. for additional followup. If symptoms persist repeating your MRI to look for changes in the herniated lumbar discs as the cause for the right low back pain irritating the sciatic nerve would be important.

## 2013-04-16 ENCOUNTER — Encounter (HOSPITAL_COMMUNITY): Payer: Self-pay | Admitting: Emergency Medicine

## 2013-04-16 ENCOUNTER — Emergency Department (HOSPITAL_COMMUNITY): Payer: MEDICAID

## 2013-04-16 ENCOUNTER — Emergency Department (HOSPITAL_COMMUNITY)
Admission: EM | Admit: 2013-04-16 | Discharge: 2013-04-16 | Disposition: A | Discharge status: Home / Self Care | Payer: MEDICAID | Attending: Emergency Medicine | Admitting: Emergency Medicine

## 2013-04-16 DIAGNOSIS — R0602 Shortness of breath: Secondary | ICD-10-CM | POA: Insufficient documentation

## 2013-04-16 DIAGNOSIS — Z87448 Personal history of other diseases of urinary system: Secondary | ICD-10-CM | POA: Insufficient documentation

## 2013-04-16 DIAGNOSIS — F419 Anxiety disorder, unspecified: Secondary | ICD-10-CM

## 2013-04-16 DIAGNOSIS — M5430 Sciatica, unspecified side: Secondary | ICD-10-CM

## 2013-04-16 DIAGNOSIS — R0789 Other chest pain: Secondary | ICD-10-CM | POA: Insufficient documentation

## 2013-04-16 DIAGNOSIS — Z8619 Personal history of other infectious and parasitic diseases: Secondary | ICD-10-CM | POA: Insufficient documentation

## 2013-04-16 DIAGNOSIS — F411 Generalized anxiety disorder: Secondary | ICD-10-CM | POA: Insufficient documentation

## 2013-04-16 DIAGNOSIS — Z87440 Personal history of urinary (tract) infections: Secondary | ICD-10-CM | POA: Insufficient documentation

## 2013-04-16 DIAGNOSIS — Z8739 Personal history of other diseases of the musculoskeletal system and connective tissue: Secondary | ICD-10-CM | POA: Insufficient documentation

## 2013-04-16 DIAGNOSIS — Z8742 Personal history of other diseases of the female genital tract: Secondary | ICD-10-CM | POA: Insufficient documentation

## 2013-04-16 LAB — CBC
HCT: 42 % (ref 36.0–46.0)
HEMOGLOBIN: 13.9 g/dL (ref 12.0–15.0)
MCH: 30 pg (ref 26.0–34.0)
MCHC: 33.1 g/dL (ref 30.0–36.0)
MCV: 90.7 fL (ref 78.0–100.0)
PLATELETS: 212 10*3/uL (ref 150–400)
RBC: 4.63 MIL/uL (ref 3.87–5.11)
RDW: 13 % (ref 11.5–15.5)
WBC: 9.3 10*3/uL (ref 4.0–10.5)

## 2013-04-16 LAB — BASIC METABOLIC PANEL
BUN: 13 mg/dL (ref 6–23)
CHLORIDE: 103 meq/L (ref 96–112)
CO2: 26 meq/L (ref 19–32)
CREATININE: 0.93 mg/dL (ref 0.50–1.10)
Calcium: 9.6 mg/dL (ref 8.4–10.5)
GFR calc Af Amer: 85 mL/min — ABNORMAL LOW (ref >90)
GFR calc non Af Amer: 74 mL/min — ABNORMAL LOW (ref >90)
GLUCOSE: 118 mg/dL — AB (ref 70–99)
Potassium: 4.2 mEq/L (ref 3.7–5.3)
Sodium: 142 mEq/L (ref 137–147)

## 2013-04-16 LAB — D-DIMER, QUANTITATIVE: D-Dimer, Quant: 0.46 ug/mL-FEU (ref 0.00–0.48)

## 2013-04-16 LAB — I-STAT TROPONIN, ED: TROPONIN I, POC: 0 ng/mL (ref 0.00–0.08)

## 2013-04-16 MED ORDER — HYDROMORPHONE HCL PF 1 MG/ML IJ SOLN
1.0000 mg | Freq: Once | INTRAMUSCULAR | Status: AC
  Administered 2013-04-16: 1 mg via INTRAVENOUS
  Filled 2013-04-16: qty 1

## 2013-04-16 MED ORDER — ALPRAZOLAM 0.25 MG PO TABS: 0.2500 mg | ORAL_TABLET | Freq: Two times a day (BID) | ORAL | Status: DC | PRN

## 2013-04-16 MED ORDER — CYCLOBENZAPRINE HCL 10 MG PO TABS: 10.0000 mg | ORAL_TABLET | Freq: Three times a day (TID) | ORAL | Status: DC | PRN

## 2013-04-16 MED ORDER — MORPHINE SULFATE 4 MG/ML IJ SOLN
4.0000 mg | Freq: Once | INTRAMUSCULAR | Status: AC
  Administered 2013-04-16: 4 mg via INTRAVENOUS
  Filled 2013-04-16: qty 1

## 2013-04-16 NOTE — ED Notes (Signed)
Pt c/o sudden onset of sob and feeling like throat was closing, reports it lasted about a hour. Pt reports she wasn't do anything in particular when it started. Upon Ems arrival pt still having some sob but she sts they told her to relax and she was able to calm down and then the sob went away. Pt speaking in full sentences, nad, skin warm and dry, resp e/u. Husband at bedside reports that she is under a lot of stress lately. Pt also was supposed to go to doctor today for sciatica. He was going to prescribe her a muscle relaxer, already takes pain medicine everyday but it isn't working so they wanted to try the muscle relaxer.

## 2013-04-16 NOTE — ED Notes (Signed)
Pt sts she is worried about going home without a prescription for pain/muscle relaxer and anti-anxiety medicine. Pt sts she just can't take the pain anymore and hasn't been able to sleep much at all due to the pain. Notified MD about pt's request, sts he will go speak with pt shortly.

## 2013-04-16 NOTE — ED Notes (Signed)
PT reports hx of anxiety attacks, but feels sx are worse today. Experienced sudden SOB, felt like throat was closing. PT ambulatory, even, unlabored respirations, NAD

## 2013-04-16 NOTE — ED Provider Notes (Signed)
CSN: [id number]     Arrival date & time 04/16/13  1420 History   First MD Initiated Contact with Patient 04/16/13 1515     Chief Complaint  Patient presents with  . Anxiety  . Shortness of Breath     (Consider location/radiation/quality/duration/timing/severity/associated sxs/prior Treatment) HPI Comments: Patient here with shortness of breath. Happened spontaneously, lasted about an hour. He was self limited. No alleviating or exacerbating factors during episode. Has had similar anxiety attacks before, but this was much worse. Also had associated chest tightness during this period. SI does not radiate. No nausea or vomiting or abdominal pain. She was seen one week ago for right-sided sciatica pain given a shot at that time. She does have a history of degenerative disease in her cycles worsening today. She feels like her leg pain was aggravating her shortness of breath. She was on the way to see the orthopedic surgeon for this but then came here for shortness of breath.  Patient is a 43 y.o. female presenting with anxiety and shortness of breath. The history is provided by the patient.  Anxiety This is a new problem. The current episode started 1 to 2 hours ago. The problem occurs constantly. The problem has been resolved. Associated symptoms include chest pain (chest tightness) and shortness of breath (felt like she couldn't breathe). Pertinent negatives include no abdominal pain and no headaches. Nothing aggravates the symptoms. Nothing relieves the symptoms. She has tried nothing for the symptoms.  Shortness of Breath Severity:  Moderate Onset quality:  Sudden Duration:  1 hour Timing:  Constant Progression:  Resolved Chronicity:  Recurrent Context: not animal exposure, not fumes, not pollens and not URI   Relieved by:  Nothing Worsened by:  Nothing tried Associated symptoms: chest pain (chest tightness)   Associated symptoms: no abdominal pain, no fever and no headaches     Past  Medical History  Diagnosis Date  . Arthritis   . Bulging discs   . Bladder infection   . Chronic UTI   . Pyelonephritis   . Uterine polyp   . Female bladder prolapse   . Yeast infection of the vagina   . Menorrhagia    Past Surgical History  Procedure Laterality Date  . Cholecystectomy    . Bladder surgery    . Cervical polypectomy    . Gynecologic cryosurgery    . Wrist surgery      Carpal Tunnel surgery  . Carpal tunnel release     Family History  Problem Relation Age of Onset  . Diabetes Father   . Hypertension Father   . Heart attack Neg Hx   . Hyperlipidemia Neg Hx    History  Substance Use Topics  . Smoking status: Never Smoker   . Smokeless tobacco: Current User    Types: Snuff  . Alcohol Use: No   OB History   Grav Para Term Preterm Abortions TAB SAB Ect Mult Living   3 3 2 1      3      Review of Systems  Constitutional: Negative for fever and chills.  Respiratory: Positive for shortness of breath (felt like she couldn't breathe).   Cardiovascular: Positive for chest pain (chest tightness).  Gastrointestinal: Negative for abdominal pain.  Neurological: Negative for headaches.  All other systems reviewed and are negative.      Allergies  Aspirin; Hydrocodone; and Septra  Home Medications   Current Outpatient Rx  Name  Route  Sig  Dispense  Refill  .  nitrofurantoin, macrocrystal-monohydrate, (MACROBID) 100 MG capsule   Oral   Take 100 mg by mouth at bedtime.          Marland Kitchen oxyCODONE-acetaminophen (PERCOCET) 10-325 MG per tablet   Oral   Take 1 tablet by mouth every 6 (six) hours as needed for pain.         Marland Kitchen EXPIRED: promethazine (PHENERGAN) 25 MG tablet   Oral   Take 1 tablet (25 mg total) by mouth every 6 (six) hours as needed for nausea.   20 tablet   0    BP 125/94  Pulse 97  Temp(Src) 98.2 F (36.8 C) (Oral)  Resp 20  SpO2 99% Physical Exam  Nursing note and vitals reviewed. Constitutional: She is oriented to person, place,  and time. She appears well-developed and well-nourished. No distress.  HENT:  Head: Normocephalic and atraumatic.  Mouth/Throat: No oropharyngeal exudate.  Eyes: EOM are normal. Pupils are equal, round, and reactive to light.  Neck: Normal range of motion. Neck supple.  Cardiovascular: Normal rate and regular rhythm.  Exam reveals no friction rub.   No murmur heard. Pulmonary/Chest: Effort normal and breath sounds normal. No respiratory distress. She has no wheezes. She has no rales.  Abdominal: Soft. She exhibits no distension. There is no tenderness. There is no rebound.  Musculoskeletal: She exhibits no edema.       Right lower leg: She exhibits tenderness (to palpation, proximal). She exhibits no edema, no deformity and no laceration.  Neurological: She is alert and oriented to person, place, and time.  Skin: No rash noted. She is not diaphoretic.    ED Course  Procedures (including critical care time) Labs Review Labs Reviewed  BASIC METABOLIC PANEL - Abnormal; Notable for the following:    Glucose, Bld 118 (*)    GFR calc non Af Amer 74 (*)    GFR calc Af Amer 85 (*)    All other components within normal limits  D-DIMER, QUANTITATIVE  CBC  I-STAT TROPOININ, ED   Imaging Review Dg Chest 2 View  04/16/2013   CLINICAL DATA:  Shortness of Breath  EXAM: CHEST  2 VIEW  COMPARISON:  July 29, 2011  FINDINGS: Lungs are clear. Heart size and pulmonary vascularity are normal. No pneumothorax. No adenopathy. No bone lesions.  IMPRESSION: No abnormality noted.   Electronically Signed   By: Bretta Bang M.D.   On: 04/16/2013 16:47    EKG Interpretation   None       Date: 04/16/2013  Rate: 100  Rhythm: sinus tachycardia  QRS Axis: normal  Intervals: normal  ST/T Wave abnormalities: normal  Conduction Disutrbances:none  Narrative Interpretation:   Old EKG Reviewed: unchanged   MDM   Final diagnoses:  Anxiety  Sciatica    43 year old female here with shortness of  breath and leg pain. The pain is likely that it has starts in her right buttock and radiates down her posterior right thigh. She stated some sores in her right thigh, however on exam she has a lot of soreness in her proximal calf. She has no calf swelling. Her SOB was one hour with mild chest tightness. No hx of cardiac disease. Hx of anxiety, however felt worse than prior anxiety attacks. Here tachycardic, uncomfortable due to leg pain. Lungs clear. R calf tender to palpation proximally, no swelling. Belly benign. Leg pain likely her sciatica, but with calf tenderness and acute SOB with tachycardia, will check D-dimer.  Labs normal. Feeling much better. With negative  dimer, no concern for PE or DVT. Patient given small amount of xanax for anxiety and some muscle relaxers for her sciatica. She has pain meds at home. Stable for discharge.  Dagmar Hait, MD 04/16/13 [phone]

## 2013-04-16 NOTE — Discharge Instructions (Signed)
Back Exercises Back exercises help treat and prevent back injuries. The goal of back exercises is to increase the strength of your abdominal and back muscles and the flexibility of your back. These exercises should be started when you no longer have back pain. Back exercises include:  Pelvic Tilt. Lie on your back with your knees bent. Tilt your pelvis until the lower part of your back is against the floor. Hold this position 5 to 10 sec and repeat 5 to 10 times.  Knee to Chest. Pull first 1 knee up against your chest and hold for 20 to 30 seconds, repeat this with the other knee, and then both knees. This may be done with the other leg straight or bent, whichever feels better.  Sit-Ups or Curl-Ups. Bend your knees 90 degrees. Start with tilting your pelvis, and do a partial, slow sit-up, lifting your trunk only 30 to 45 degrees off the floor. Take at least 2 to 3 seconds for each sit-up. Do not do sit-ups with your knees out straight. If partial sit-ups are difficult, simply do the above but with only tightening your abdominal muscles and holding it as directed.  Hip-Lift. Lie on your back with your knees flexed 90 degrees. Push down with your feet and shoulders as you raise your hips a couple inches off the floor; hold for 10 seconds, repeat 5 to 10 times.  Back arches. Lie on your stomach, propping yourself up on bent elbows. Slowly press on your hands, causing an arch in your low back. Repeat 3 to 5 times. Any initial stiffness and discomfort should lessen with repetition over time.  Shoulder-Lifts. Lie face down with arms beside your body. Keep hips and torso pressed to floor as you slowly lift your head and shoulders off the floor. Do not overdo your exercises, especially in the beginning. Exercises may cause you some mild back discomfort which lasts for a few minutes; however, if the pain is more severe, or lasts for more than 15 minutes, do not continue exercises until you see your caregiver.  Improvement with exercise therapy for back problems is slow.  See your caregivers for assistance with developing a proper back exercise program. Document Released: 03/21/2004 Document Revised: 05/06/2011 Document Reviewed: 12/13/2010 [employer] Patient Information 2014 [employer].   Sciatica Sciatica is pain, weakness, numbness, or tingling along the path of the sciatic nerve. The nerve starts in the lower back and runs down the back of each leg. The nerve controls the muscles in the lower leg and in the back of the knee, while also providing sensation to the back of the thigh, lower leg, and the sole of your foot. Sciatica is a symptom of another medical condition. For instance, nerve damage or certain conditions, such as a herniated disk or bone spur on the spine, pinch or put pressure on the sciatic nerve. This causes the pain, weakness, or other sensations normally associated with sciatica. Generally, sciatica only affects one side of the body. CAUSES   Herniated or slipped disc.  Degenerative disk disease.  A pain disorder involving the narrow muscle in the buttocks (piriformis syndrome).  Pelvic injury or fracture.  Pregnancy.  Tumor (rare). SYMPTOMS  Symptoms can vary from mild to very severe. The symptoms usually travel from the low back to the buttocks and down the back of the leg. Symptoms can include:  Mild tingling or dull aches in the lower back, leg, or hip.  Numbness in the back of the calf or sole   of the foot.  Burning sensations in the lower back, leg, or hip.  Sharp pains in the lower back, leg, or hip.  Leg weakness.  Severe back pain inhibiting movement. These symptoms may get worse with coughing, sneezing, laughing, or prolonged sitting or standing. Also, being overweight may worsen symptoms. DIAGNOSIS  Your caregiver will perform a physical exam to look for common symptoms of sciatica. He or she may ask you to do certain movements or activities that would  trigger sciatic nerve pain. Other tests may be performed to find the cause of the sciatica. These may include:  Blood tests.  X-rays.  Imaging tests, such as an MRI or CT scan. TREATMENT  Treatment is directed at the cause of the sciatic pain. Sometimes, treatment is not necessary and the pain and discomfort goes away on its own. If treatment is needed, your caregiver may suggest:  Over-the-counter medicines to relieve pain.  Prescription medicines, such as anti-inflammatory medicine, muscle relaxants, or narcotics.  Applying heat or ice to the painful area.  Steroid injections to lessen pain, irritation, and inflammation around the nerve.  Reducing activity during periods of pain.  Exercising and stretching to strengthen your abdomen and improve flexibility of your spine. Your caregiver may suggest losing weight if the extra weight makes the back pain worse.  Physical therapy.  Surgery to eliminate what is pressing or pinching the nerve, such as a bone spur or part of a herniated disk. HOME CARE INSTRUCTIONS   Only take over-the-counter or prescription medicines for pain or discomfort as directed by your caregiver.  Apply ice to the affected area for 20 minutes, 3 4 times a day for the first 48 72 hours. Then try heat in the same way.  Exercise, stretch, or perform your usual activities if these do not aggravate your pain.  Attend physical therapy sessions as directed by your caregiver.  Keep all follow-up appointments as directed by your caregiver.  Do not wear high heels or shoes that do not provide proper support.  Check your mattress to see if it is too soft. A firm mattress may lessen your pain and discomfort. SEEK IMMEDIATE MEDICAL CARE IF:   You lose control of your bowel or bladder (incontinence).  You have increasing weakness in the lower back, pelvis, buttocks, or legs.  You have redness or swelling of your back.  You have a burning sensation when you  urinate.  You have pain that gets worse when you lie down or awakens you at night.  Your pain is worse than you have experienced in the past.  Your pain is lasting longer than 4 weeks.  You are suddenly losing weight without reason. MAKE SURE YOU:  Understand these instructions.  Will watch your condition.  Will get help right away if you are not doing well or get worse. Document Released: 02/05/2001 Document Revised: 08/13/2011 Document Reviewed: 06/23/2011 [employer] Patient Information 2014 [employer].  

## 2013-05-18 ENCOUNTER — Other Ambulatory Visit: Payer: Self-pay | Admitting: *Deleted

## 2013-05-18 DIAGNOSIS — M7989 Other specified soft tissue disorders: Secondary | ICD-10-CM

## 2013-05-18 DIAGNOSIS — M79609 Pain in unspecified limb: Secondary | ICD-10-CM

## 2013-06-09 ENCOUNTER — Encounter: Payer: Self-pay | Admitting: Vascular Surgery

## 2013-06-10 ENCOUNTER — Ambulatory Visit (HOSPITAL_COMMUNITY)
Admission: RE | Admit: 2013-06-10 | Discharge: 2013-06-10 | Disposition: A | Discharge status: Home / Self Care | Payer: Medicaid Other | Source: Ambulatory Visit | Attending: Vascular Surgery | Admitting: Vascular Surgery

## 2013-06-10 ENCOUNTER — Ambulatory Visit (INDEPENDENT_AMBULATORY_CARE_PROVIDER_SITE_OTHER): Payer: Medicaid Other | Admitting: Vascular Surgery

## 2013-06-10 ENCOUNTER — Encounter: Payer: Self-pay | Admitting: Vascular Surgery

## 2013-06-10 VITALS — BP 134/82 | HR 74 | Resp 16 | Ht 64.0 in | Wt 262.0 lb

## 2013-06-10 DIAGNOSIS — M79609 Pain in unspecified limb: Secondary | ICD-10-CM

## 2013-06-10 DIAGNOSIS — M7989 Other specified soft tissue disorders: Secondary | ICD-10-CM | POA: Insufficient documentation

## 2013-06-10 NOTE — Progress Notes (Signed)
[facility] [place] HISTORY AND PHYSICAL   History of Present Illness:  Patient is a 43 y.o. year old female who presents for evaluation of right leg pain. Patient states that she has pain in her right leg. This occurs when she starts to stand or walking after short distances. She does not smoke but does use tobacco snuff. She has been followed at [facility] for sciatica. She denies any problems with the left leg. She does not really describe asymmetric swelling. She has intermittently worn some compression stockings in the past.  Other medical problems include obesity, chronic back pain, sciatica as described above. All these are currently stable. She does have family history of peripheral arterial disease in her father who I treated in the past.  Past Medical History  Diagnosis Date  . Arthritis   . Bulging discs   . Bladder infection   . Chronic UTI   . Pyelonephritis   . Uterine polyp   . Female bladder prolapse   . Yeast infection of the vagina   . Menorrhagia     Past Surgical History  Procedure Laterality Date  . Cholecystectomy    . Bladder surgery    . Cervical polypectomy    . Gynecologic cryosurgery    . Wrist surgery      Carpal Tunnel surgery  . Carpal tunnel release      Social History History  Substance Use Topics  . Smoking status: Never Smoker   . Smokeless tobacco: Current User    Types: Snuff  . Alcohol Use: No    Family History Family History  Problem Relation Age of Onset  . Diabetes Father   . Hypertension Father   . Heart attack Neg Hx   . Hyperlipidemia Neg Hx     Allergies  Allergies  Allergen Reactions  . Aspirin Other (See Comments)    Upsets ulcer  . Hydrocodone Other (See Comments)    Fidgety and jittery  . Septra [Sulfamethoxazole-Trimethoprim] Nausea And Vomiting     Current Outpatient Prescriptions  Medication Sig Dispense Refill  . amoxicillin (AMOXIL) 875 MG tablet Take 875 mg by  mouth 2 (two) times daily.      . diazepam (VALIUM) 5 MG tablet Take 5 mg by mouth every 8 (eight) hours as needed for anxiety.      Marland Kitchen. oxyCODONE-acetaminophen (PERCOCET) 10-325 MG per tablet Take 1 tablet by mouth every 6 (six) hours as needed for pain.      Marland Kitchen. ALPRAZolam (XANAX) 0.25 MG tablet Take 1 tablet (0.25 mg total) by mouth 2 (two) times daily as needed for anxiety.  5 tablet  0  . cyclobenzaprine (FLEXERIL) 10 MG tablet Take 1 tablet (10 mg total) by mouth 3 (three) times daily as needed for muscle spasms.  20 tablet  0  . nitrofurantoin, macrocrystal-monohydrate, (MACROBID) 100 MG capsule Take 100 mg by mouth at bedtime.       . promethazine (PHENERGAN) 25 MG tablet Take 1 tablet (25 mg total) by mouth every 6 (six) hours as needed for nausea.  20 tablet  0   No current facility-administered medications for this visit.    ROS:   General:  No weight loss, Fever, chills  HEENT: No recent headaches, no nasal bleeding, no visual changes, no sore throat  Neurologic: No dizziness, blackouts, seizures. No recent symptoms of stroke or mini- stroke. No recent episodes of slurred speech, or temporary blindness.  Cardiac: No recent episodes of chest  pain/pressure, no shortness of breath at rest.  + shortness of breath with exertion.  Denies history of atrial fibrillation or irregular heartbeat  Vascular: No history of rest pain in feet.  No history of claudication.  No history of non-healing ulcer, No history of DVT   Pulmonary: No home oxygen, no productive cough, no hemoptysis,  No asthma or wheezing  Musculoskeletal:  [ ]  Arthritis, [x ] Low back pain,  [ ]  Joint pain  Hematologic:No history of hypercoagulable state.  No history of easy bleeding.  No history of anemia  Gastrointestinal: No hematochezia or melena,  No gastroesophageal reflux, no trouble swallowing  Urinary: [ ]  chronic Kidney disease, [ ]  on HD - [ ]  MWF or [ ]  TTHS, [ ]  Burning with urination, [ ]  Frequent urination,  [ ]  Difficulty urinating;   Skin: No rashes  Psychological: + history of anxiety,  No history of depression   Physical Examination  Filed Vitals:   06/10/13 1220  BP: 134/82  Pulse: 74  Resp: 16  Height: 5\' 4"  (1.626 m)  Weight: 262 lb (118.842 kg)    Body mass index is 44.95 kg/(m^2).  General:  Alert and oriented, no acute distress HEENT: Normal Neck: No bruit or JVD Pulmonary: Clear to auscultation bilaterally Cardiac: Regular Rate and Rhythm without murmur Abdomen: Soft, non-tender, non-distended, no mass Skin: No rash Extremity Pulses:  2+ radial, brachial, femoral, absent dorsalis pedis, 1-2+ posterior tibial pulses bilaterally Musculoskeletal: No deformity or edema  Neurologic: Upper and lower extremity motor 5/5 and symmetric  DATA:  Patient had a right lower extremity venous duplex scan today. I reviewed and interpreted this study. She had no evidence of DVT. She had no evidence of deep vein reflux. She had no evidence of superficial venous reflux. This was a normal study.  ASSESSMENT:  Right leg pain most likely musculoskeletal. However, she does have family history of peripheral arterial disease and doesn't use tobacco and has borderline diabetes. She is at risk for peripheral arterial disease. In light of this with her right leg pain we will have her followup for one additional visit with ABIs at that visit. She will wear compression stockings as much as possible to improve leg symptoms. She will also try to lose weight. She will also try to refrain from abusing tobacco.   PLAN:  Followup ABIs as indicated above.  Fabienne Brunsharles Fields, MD [facility] [place] Office: [phone] Pager: [phone]

## 2013-06-11 NOTE — Addendum Note (Signed)
Addended by: Adria DillELDRIDGE-LEWIS, Avalina Benko L on: 06/11/2013 12:38 PM   Modules accepted: Orders

## 2013-07-08 ENCOUNTER — Other Ambulatory Visit: Payer: Self-pay | Admitting: Family Medicine

## 2013-07-08 DIAGNOSIS — N938 Other specified abnormal uterine and vaginal bleeding: Secondary | ICD-10-CM

## 2013-07-08 DIAGNOSIS — N949 Unspecified condition associated with female genital organs and menstrual cycle: Secondary | ICD-10-CM

## 2013-07-12 ENCOUNTER — Other Ambulatory Visit: Payer: Medicaid Other

## 2013-08-02 ENCOUNTER — Other Ambulatory Visit (HOSPITAL_COMMUNITY): Payer: Self-pay | Admitting: Family Medicine

## 2013-08-02 DIAGNOSIS — Z1231 Encounter for screening mammogram for malignant neoplasm of breast: Secondary | ICD-10-CM

## 2013-08-11 ENCOUNTER — Ambulatory Visit (HOSPITAL_COMMUNITY): Payer: Medicaid Other

## 2013-08-17 ENCOUNTER — Ambulatory Visit (HOSPITAL_COMMUNITY)
Admission: RE | Admit: 2013-08-17 | Discharge: 2013-08-17 | Disposition: A | Discharge status: Home / Self Care | Payer: Medicaid Other | Source: Ambulatory Visit | Attending: Family Medicine | Admitting: Family Medicine

## 2013-08-17 ENCOUNTER — Ambulatory Visit (HOSPITAL_COMMUNITY): Payer: Medicaid Other

## 2013-08-17 DIAGNOSIS — Z1231 Encounter for screening mammogram for malignant neoplasm of breast: Secondary | ICD-10-CM

## 2013-10-13 ENCOUNTER — Ambulatory Visit: Payer: Self-pay | Admitting: Obstetrics

## 2013-11-09 ENCOUNTER — Telehealth: Payer: Self-pay | Admitting: *Deleted

## 2013-11-09 NOTE — Telephone Encounter (Signed)
Patients husband called stating they have no insurance right now and that his wife has an appointment this month to have her nexplanon removed. Patients husband states would it hurt to wait.   CB: Spoke with patient. Patient advised that it would not hurt to leave the nexplanon in her arm that when ever she got insurance or her insurance was reactivated to call the office to reschedule her nexplanon removal. Patient notified that the birth control would just would no longer be effective. Patient states she was told when she had it put in that if she didn't have insurance that they would work with her to take it out, would we set up payments or anything. Patient advised that we could probably set up payments if she wanted but that the nexplanon was not going to hurt her being in. Patient states her insurance should be reactivated next month. Patient advised to wait til next month when her insurance was active and call to reschedule. Patient voiced understanding.

## 2013-12-27 ENCOUNTER — Encounter: Payer: Self-pay | Admitting: Vascular Surgery

## 2014-06-15 ENCOUNTER — Encounter: Payer: Self-pay | Admitting: Vascular Surgery

## 2014-06-16 ENCOUNTER — Encounter (HOSPITAL_COMMUNITY): Payer: Medicaid Other

## 2014-06-16 ENCOUNTER — Ambulatory Visit: Payer: Medicaid Other | Admitting: Vascular Surgery

## 2014-09-27 ENCOUNTER — Emergency Department (HOSPITAL_COMMUNITY)
Admission: EM | Admit: 2014-09-27 | Discharge: 2014-09-27 | Disposition: A | Discharge status: Home / Self Care | Payer: Medicaid Other | Attending: Emergency Medicine | Admitting: Emergency Medicine

## 2014-09-27 ENCOUNTER — Encounter (HOSPITAL_COMMUNITY): Payer: Self-pay | Admitting: Cardiology

## 2014-09-27 DIAGNOSIS — Z8742 Personal history of other diseases of the female genital tract: Secondary | ICD-10-CM | POA: Insufficient documentation

## 2014-09-27 DIAGNOSIS — L03116 Cellulitis of left lower limb: Secondary | ICD-10-CM | POA: Insufficient documentation

## 2014-09-27 DIAGNOSIS — M1990 Unspecified osteoarthritis, unspecified site: Secondary | ICD-10-CM | POA: Insufficient documentation

## 2014-09-27 DIAGNOSIS — Z8619 Personal history of other infectious and parasitic diseases: Secondary | ICD-10-CM | POA: Diagnosis not present

## 2014-09-27 DIAGNOSIS — Z9049 Acquired absence of other specified parts of digestive tract: Secondary | ICD-10-CM | POA: Insufficient documentation

## 2014-09-27 DIAGNOSIS — R21 Rash and other nonspecific skin eruption: Secondary | ICD-10-CM | POA: Diagnosis present

## 2014-09-27 DIAGNOSIS — N390 Urinary tract infection, site not specified: Secondary | ICD-10-CM | POA: Insufficient documentation

## 2014-09-27 LAB — CBC WITH DIFFERENTIAL/PLATELET
Basophils Absolute: 0 10*3/uL (ref 0.0–0.1)
Basophils Relative: 0 % (ref 0–1)
EOS ABS: 0 10*3/uL (ref 0.0–0.7)
EOS PCT: 0 % (ref 0–5)
HCT: 41.8 % (ref 36.0–46.0)
HEMOGLOBIN: 13.7 g/dL (ref 12.0–15.0)
LYMPHS ABS: 1.3 10*3/uL (ref 0.7–4.0)
Lymphocytes Relative: 12 % (ref 12–46)
MCH: 29.4 pg (ref 26.0–34.0)
MCHC: 32.8 g/dL (ref 30.0–36.0)
MCV: 89.7 fL (ref 78.0–100.0)
MONOS PCT: 7 % (ref 3–12)
Monocytes Absolute: 0.7 10*3/uL (ref 0.1–1.0)
NEUTROS ABS: 9 10*3/uL — AB (ref 1.7–7.7)
NEUTROS PCT: 81 % — AB (ref 43–77)
PLATELETS: 187 10*3/uL (ref 150–400)
RBC: 4.66 MIL/uL (ref 3.87–5.11)
RDW: 13.2 % (ref 11.5–15.5)
WBC: 11.1 10*3/uL — AB (ref 4.0–10.5)

## 2014-09-27 LAB — URINALYSIS, ROUTINE W REFLEX MICROSCOPIC
Bilirubin Urine: NEGATIVE
GLUCOSE, UA: NEGATIVE mg/dL
Ketones, ur: NEGATIVE mg/dL
Nitrite: POSITIVE — AB
Protein, ur: 30 mg/dL — AB
Specific Gravity, Urine: >1.03 — ABNORMAL HIGH (ref 1.005–1.030)
Urobilinogen, UA: 1 mg/dL (ref 0.0–1.0)
pH: 5.5 (ref 5.0–8.0)

## 2014-09-27 LAB — BASIC METABOLIC PANEL
Anion gap: 9 (ref 5–15)
BUN: 14 mg/dL (ref 6–20)
CALCIUM: 9 mg/dL (ref 8.9–10.3)
CO2: 25 mmol/L (ref 22–32)
Chloride: 106 mmol/L (ref 101–111)
Creatinine, Ser: 1.04 mg/dL — ABNORMAL HIGH (ref 0.44–1.00)
GFR calc Af Amer: >60 mL/min (ref >60)
GFR calc non Af Amer: >60 mL/min (ref >60)
Glucose, Bld: 116 mg/dL — ABNORMAL HIGH (ref 65–99)
Potassium: 3.9 mmol/L (ref 3.5–5.1)
SODIUM: 140 mmol/L (ref 135–145)

## 2014-09-27 LAB — LACTIC ACID, PLASMA
Lactic Acid, Venous: 1.2 mmol/L (ref 0.5–2.0)
Lactic Acid, Venous: 2.3 mmol/L (ref 0.5–2.0)

## 2014-09-27 LAB — CBG MONITORING, ED: GLUCOSE-CAPILLARY: 108 mg/dL — AB (ref 65–99)

## 2014-09-27 LAB — URINE MICROSCOPIC-ADD ON

## 2014-09-27 MED ORDER — DIPHENHYDRAMINE HCL 50 MG/ML IJ SOLN
25.0000 mg | Freq: Once | INTRAMUSCULAR | Status: AC
  Administered 2014-09-27: 25 mg via INTRAVENOUS

## 2014-09-27 MED ORDER — VANCOMYCIN HCL IN DEXTROSE 1-5 GM/200ML-% IV SOLN
1000.0000 mg | Freq: Once | INTRAVENOUS | Status: AC
  Administered 2014-09-27: 1000 mg via INTRAVENOUS
  Filled 2014-09-27: qty 200

## 2014-09-27 MED ORDER — SODIUM CHLORIDE 0.9 % IV BOLUS (SEPSIS)
1000.0000 mL | Freq: Once | INTRAVENOUS | Status: AC
  Administered 2014-09-27: 1000 mL via INTRAVENOUS

## 2014-09-27 MED ORDER — DOXYCYCLINE HYCLATE 100 MG PO CAPS
100.0000 mg | ORAL_CAPSULE | Freq: Two times a day (BID) | ORAL | Status: DC
Start: 2014-09-27 — End: 2015-04-27

## 2014-09-27 MED ORDER — CEPHALEXIN 500 MG PO CAPS: 500.0000 mg | ORAL_CAPSULE | Freq: Four times a day (QID) | ORAL | Status: DC

## 2014-09-27 MED ORDER — DIPHENHYDRAMINE HCL 50 MG/ML IJ SOLN
INTRAMUSCULAR | Status: AC
  Filled 2014-09-27: qty 1

## 2014-09-27 NOTE — ED Notes (Signed)
Left lower leg rash / redness

## 2014-09-27 NOTE — ED Notes (Signed)
Called to patient room. Patient c/o pain and swelling to IV site in left forearm. IV flushes without difficulty, however, causes patient pain. No evidence of allergic reaction type symptoms. Consistent with infiltration. IV removed and new IV established in right forearm. Vancomycin resumed.

## 2014-09-27 NOTE — ED Notes (Signed)
Patient with redness to upper chest and back. Patient c/o itching. Ivery Quale, PA notified and will be in to assess patient.

## 2014-09-27 NOTE — ED Notes (Signed)
PA notified of Lactic Acid.

## 2014-09-27 NOTE — Discharge Instructions (Signed)
Your workup suggest cellulitis of your lower leg, and a urinary tract infection. Please increase fluids. Please use doxycycline 2 times daily, and Keflex with breakfast, lunch, dinner, and bedtime until all taken. Please see Dr. Arlyce Dice or member of his team for recheck of your cellulitis area in 3 or 4 days. Please have your urine rechecked in 10 days to document complete resolution. Please elevate your legs above your waist is much as possible. Cellulitis Cellulitis is an infection of the skin and the tissue beneath it. The infected area is usually red and tender. Cellulitis occurs most often in the arms and lower legs.  CAUSES  Cellulitis is caused by bacteria that enter the skin through cracks or cuts in the skin. The most common types of bacteria that cause cellulitis are staphylococci and streptococci. SIGNS AND SYMPTOMS   Redness and warmth.  Swelling.  Tenderness or pain.  Fever. DIAGNOSIS  Your health care provider can usually determine what is wrong based on a physical exam. Blood tests may also be done. TREATMENT  Treatment usually involves taking an antibiotic medicine. HOME CARE INSTRUCTIONS   Take your antibiotic medicine as directed by your health care provider. Finish the antibiotic even if you start to feel better.  Keep the infected arm or leg elevated to reduce swelling.  Apply a warm cloth to the affected area up to 4 times per day to relieve pain.  Take medicines only as directed by your health care provider.  Keep all follow-up visits as directed by your health care provider. SEEK MEDICAL CARE IF:   You notice red streaks coming from the infected area.  Your red area gets larger or turns dark in color.  Your bone or joint underneath the infected area becomes painful after the skin has healed.  Your infection returns in the same area or another area.  You notice a swollen bump in the infected area.  You develop new symptoms.  You have a fever. SEEK  IMMEDIATE MEDICAL CARE IF:   You feel very sleepy.  You develop vomiting or diarrhea.  You have a general ill feeling (malaise) with muscle aches and pains. MAKE SURE YOU:   Understand these instructions.  Will watch your condition.  Will get help right away if you are not doing well or get worse. Document Released: 11/21/2004 Document Revised: 06/28/2013 Document Reviewed: 04/29/2011 [facility] Patient Information 2015 [place], [place]. This information is not intended to replace advice given to you by your health care provider. Make sure you discuss any questions you have with your health care provider.  Urinary Tract Infection A urinary tract infection (UTI) can occur any place along the urinary tract. The tract includes the kidneys, ureters, bladder, and urethra. A type of germ called bacteria often causes a UTI. UTIs are often helped with antibiotic medicine.  HOME CARE   If given, take antibiotics as told by your doctor. Finish them even if you start to feel better.  Drink enough fluids to keep your pee (urine) clear or pale yellow.  Avoid tea, drinks with caffeine, and bubbly (carbonated) drinks.  Pee often. Avoid holding your pee in for a long time.  Pee before and after having sex (intercourse).  Wipe from front to back after you poop (bowel movement) if you are a woman. Use each tissue only once. GET HELP RIGHT AWAY IF:   You have back pain.  You have lower belly (abdominal) pain.  You have chills.  You feel sick to your stomach (  nauseous).  You throw up (vomit).  Your burning or discomfort with peeing does not go away.  You have a fever.  Your symptoms are not better in 3 days. MAKE SURE YOU:   Understand these instructions.  Will watch your condition.  Will get help right away if you are not doing well or get worse. Document Released: 07/31/2007 Document Revised: 11/06/2011 Document Reviewed: 09/12/2011 [facility] Patient Information 2015 [place],  [place]. This information is not intended to replace advice given to you by your health care provider. Make sure you discuss any questions you have with your health care provider.

## 2014-09-27 NOTE — ED Notes (Signed)
Patient with no complaints at this time. Respirations even and unlabored. Skin warm/dry. Discharge instructions reviewed with patient at this time. Patient given opportunity to voice concerns/ask questions. IV removed per policy and band-aid applied to site. Patient discharged at this time and left Emergency Department via wheelchair.  

## 2014-09-27 NOTE — ED Provider Notes (Signed)
CSN: [id number]     Arrival date & time 09/27/14  1114 History   First MD Initiated Contact with Patient 09/27/14 1132     Chief Complaint  Patient presents with  . Cellulitis     (Consider location/radiation/quality/duration/timing/severity/associated sxs/prior Treatment) HPI Comments: Patient is a 44 year old female presents to the emergency department with complaint of cellulitis.  Patient states that she is noted a red warm painful rash of the left lower leg for the past 2 days. She is unaware of any injury or trauma to this area. She's not aware of any bites of any kind. She's not had any recent procedures involving the lower extremities. This no history of diabetes. There is no history of any immune compromising illnesses. His been no high fever reported, however the patient states she is not felt well over the last 2 days.  The history is provided by the patient.    Past Medical History  Diagnosis Date  . Arthritis   . Bulging discs   . Bladder infection   . Chronic UTI   . Pyelonephritis   . Uterine polyp   . Female bladder prolapse   . Yeast infection of the vagina   . Menorrhagia    Past Surgical History  Procedure Laterality Date  . Cholecystectomy    . Bladder surgery    . Cervical polypectomy    . Gynecologic cryosurgery    . Wrist surgery      Carpal Tunnel surgery  . Carpal tunnel release     Family History  Problem Relation Age of Onset  . Diabetes Father   . Hypertension Father   . Heart attack Neg Hx   . Hyperlipidemia Neg Hx    History  Substance Use Topics  . Smoking status: Never Smoker   . Smokeless tobacco: Current User    Types: Snuff  . Alcohol Use: No   OB History    Gravida Para Term Preterm AB TAB SAB Ectopic Multiple Living   Review of Systems  Constitutional: Negative for activity change.       All ROS Neg except as noted in HPI  HENT: Negative for nosebleeds.   Eyes: Negative for photophobia and discharge.   Respiratory: Negative for cough, shortness of breath and wheezing.   Cardiovascular: Negative for chest pain and palpitations.  Gastrointestinal: Negative for abdominal pain and blood in stool.  Genitourinary: Negative for dysuria, frequency and hematuria.  Musculoskeletal: Positive for back pain and arthralgias. Negative for neck pain.  Skin: Positive for wound.  Neurological: Negative for dizziness, seizures and speech difficulty.  Psychiatric/Behavioral: Negative for hallucinations and confusion.  All other systems reviewed and are negative.     Allergies  Aspirin; Hydrocodone; and Septra  Home Medications   Prior to Admission medications   Medication Sig Start Date End Date Taking? Authorizing Provider  oxyCODONE-acetaminophen (PERCOCET) 10-325 MG per tablet Take 1 tablet by mouth every 6 (six) hours as needed for pain.   Yes Historical Provider, MD   BP 140/78 mmHg  Pulse 119  Temp(Src) 99.3 F (37.4 C) (Oral)  Resp 20  Ht  (1.6 m)  Wt 263 lb (119.296 kg)  BMI 46.60 kg/m2  SpO2 99% Physical Exam  Constitutional: She is oriented to person, place, and time. She appears well-developed and well-nourished.  Non-toxic appearance.  HENT:  Head: Normocephalic.  Right Ear: Tympanic membrane and external ear normal.  Left Ear: Tympanic membrane and external ear normal.  Eyes: EOM and lids are normal. Pupils are equal, round, and reactive to light.  Neck: Normal range of motion. Neck supple. Carotid bruit is not present.  Cardiovascular: Normal rate, regular rhythm, normal heart sounds, intact distal pulses and normal pulses.   Pulmonary/Chest: Breath sounds normal. No respiratory distress.  Abdominal: Soft. Bowel sounds are normal. There is no tenderness. There is no guarding.  Mild CVA tenderness on the left.  Musculoskeletal: Normal range of motion.  There is semi-circumferential redness of the left lower extremity. The area is warm and tender to touch. There is no  open skin area appreciated.  This will range of motion of the toes, ankle, knees, and hip of the left lower extremity. There is tenderness in the inguinal area. There is a fungal type rash in the skin fold between the thigh and the lower abdomen. No palpable abscess appreciated. There are a few palpable lymph nodes present.  Lymphadenopathy:       Head (right side): No submandibular adenopathy present.       Head (left side): No submandibular adenopathy present.    She has no cervical adenopathy.  Neurological: She is alert and oriented to person, place, and time. She has normal strength. No cranial nerve deficit or sensory deficit.  Skin: Skin is warm and dry.  Psychiatric: She has a normal mood and affect. Her speech is normal.  Nursing note and vitals reviewed.   ED Course  Procedures (including critical care time) Labs Review Labs Reviewed  CBC WITH DIFFERENTIAL/PLATELET - Abnormal; Notable for the following:    WBC 11.1 (*)    Neutrophils Relative % 81 (*)    Neutro Abs 9.0 (*)    All other components within normal limits  BASIC METABOLIC PANEL - Abnormal; Notable for the following:    Glucose, Bld 116 (*)    Creatinine, Ser 1.04 (*)    All other components within normal limits  CBG MONITORING, ED - Abnormal; Notable for the following:    Glucose-Capillary 108 (*)    All other components within normal limits  CULTURE, BLOOD (ROUTINE X 2)  CULTURE, BLOOD (ROUTINE X 2)  LACTIC ACID, PLASMA  LACTIC ACID, PLASMA    Imaging Review No results found.   EKG Interpretation None      MDM  BASIC metabolic panel is within normal limits with exception of the creatinine being elevated at 1.04. The lactic acid is normal at 1.2. The complete blood count shows an elevation in the white blood cells of 11,100 no shift to the left appreciated. CBG slightly elevated at 108. Urinalysis reveals positive nitrates and leukocyte esterase. There is some proteinuria present. This 21-50 white  blood cells and many bacteria present. A culture of the urine was sent to lab. Blood cultures were also sent to the lab.   Patient was treated in the emergency department with IV vancomycin. Patient will be treated with oral doxycycline for what is believed to be a cellulitis involving the lower leg. Keflex will be used for the urinary tract infection. The patient is advised to return to the emergency department immediately if any excessive vomiting, high fever, temperature elevations to respond to Tylenol ibuprofen, deterioration in her general condition. Patient acknowledges understanding of this discharge plan.    Final diagnoses:  None    *I have reviewed nursing notes, vital signs, and all appropriate lab and imaging results for this patient.**    Ivery Quale,  PA-C 09/29/14 1813  Blane Ohara, MD 10/01/14 [phone]

## 2014-09-30 ENCOUNTER — Encounter (HOSPITAL_COMMUNITY): Payer: Self-pay | Admitting: Emergency Medicine

## 2014-09-30 ENCOUNTER — Emergency Department (HOSPITAL_COMMUNITY)
Admission: EM | Admit: 2014-09-30 | Discharge: 2014-09-30 | Disposition: A | Discharge status: Home / Self Care | Payer: Medicaid Other | Attending: Emergency Medicine | Admitting: Emergency Medicine

## 2014-09-30 DIAGNOSIS — Z87448 Personal history of other diseases of urinary system: Secondary | ICD-10-CM | POA: Insufficient documentation

## 2014-09-30 DIAGNOSIS — Z8742 Personal history of other diseases of the female genital tract: Secondary | ICD-10-CM | POA: Insufficient documentation

## 2014-09-30 DIAGNOSIS — Z792 Long term (current) use of antibiotics: Secondary | ICD-10-CM | POA: Insufficient documentation

## 2014-09-30 DIAGNOSIS — Z87440 Personal history of urinary (tract) infections: Secondary | ICD-10-CM | POA: Insufficient documentation

## 2014-09-30 DIAGNOSIS — L03116 Cellulitis of left lower limb: Secondary | ICD-10-CM | POA: Diagnosis not present

## 2014-09-30 DIAGNOSIS — M1990 Unspecified osteoarthritis, unspecified site: Secondary | ICD-10-CM | POA: Diagnosis not present

## 2014-09-30 DIAGNOSIS — M79662 Pain in left lower leg: Secondary | ICD-10-CM | POA: Diagnosis present

## 2014-09-30 LAB — URINE CULTURE: Culture: >=100000

## 2014-09-30 NOTE — Discharge Instructions (Signed)

## 2014-09-30 NOTE — ED Notes (Signed)
Pt states that she is here for a recheck of cellulitis on left leg.  States is currently on abx and that her leg looks improved.

## 2014-09-30 NOTE — ED Provider Notes (Signed)
CSN: [id number]     Arrival date & time 09/30/14  0949 History   First MD Initiated Contact with Patient 09/30/14 0957     Chief Complaint  Patient presents with  . Leg Pain     (Consider location/radiation/quality/duration/timing/severity/associated sxs/prior Treatment) HPI   Cathy Rowland is a 44 y.o. female who presents to the Emergency Department was told to come back today for recheck of her left lower leg.  She was seen here on 09/27/14 and treated for cellulitis.  She is taking doxycycline and keflex and reports significant improvement in her symptoms.  She denies fever, increased pain, redness or swelling.  No hx of diabetes   Past Medical History  Diagnosis Date  . Arthritis   . Bulging discs   . Bladder infection   . Chronic UTI   . Pyelonephritis   . Uterine polyp   . Female bladder prolapse   . Yeast infection of the vagina   . Menorrhagia    Past Surgical History  Procedure Laterality Date  . Cholecystectomy    . Bladder surgery    . Cervical polypectomy    . Gynecologic cryosurgery    . Wrist surgery      Carpal Tunnel surgery  . Carpal tunnel release     Family History  Problem Relation Age of Onset  . Diabetes Father   . Hypertension Father   . Heart attack Neg Hx   . Hyperlipidemia Neg Hx    History  Substance Use Topics  . Smoking status: Never Smoker   . Smokeless tobacco: Current User    Types: Snuff  . Alcohol Use: No   OB History    Gravida Para Term Preterm AB TAB SAB Ectopic Multiple Living   3 3 2 1      3      Review of Systems  Constitutional: Negative for fever, chills, activity change and appetite change.  Respiratory: Negative for shortness of breath.   Cardiovascular: Negative for chest pain.  Gastrointestinal: Negative for vomiting.  Musculoskeletal: Negative for joint swelling and arthralgias.  Skin: Positive for rash.       Redness of the left lower leg  Neurological: Negative for weakness and numbness.  All other  systems reviewed and are negative.     Allergies  Aspirin; Hydrocodone; and Septra  Home Medications   Prior to Admission medications   Medication Sig Start Date End Date Taking? Authorizing Provider  cephALEXin (KEFLEX) 500 MG capsule Take 1 capsule (500 mg total) by mouth 4 (four) times daily. 09/27/14   Ivery Quale, PA-C  doxycycline (VIBRAMYCIN) 100 MG capsule Take 1 capsule (100 mg total) by mouth 2 (two) times daily. 09/27/14   Ivery Quale, PA-C  oxyCODONE-acetaminophen (PERCOCET) 10-325 MG per tablet Take 1 tablet by mouth every 6 (six) hours as needed for pain.    Historical Provider, MD   BP 129/79 mmHg  Pulse 99  Temp(Src) 98.4 F (36.9 C) (Oral)  Resp 20  Ht 5\' 4"  (1.626 m)  Wt 264 lb 1.6 oz (119.795 kg)  BMI 45.31 kg/m2  SpO2 100% Physical Exam  Constitutional: She is oriented to person, place, and time. She appears well-developed and well-nourished. No distress.  HENT:  Head: Atraumatic.  Cardiovascular: Normal rate, regular rhythm and intact distal pulses.   No murmur heard. Pulmonary/Chest: Effort normal and breath sounds normal.  Musculoskeletal: Normal range of motion. She exhibits no edema or tenderness.  Pt has full ROM of the left knee,  nml plantar and dorsiflexion of the foot.  DP pulse brisk bilaterally, distal sensation intact.  No calf tenderness or edema  Neurological: She is alert and oriented to person, place, and time. She exhibits normal muscle tone. Coordination normal.  Skin: Skin is warm.  Mildly erythema to the medial aspect of the left LE.  Improved from previous visit.  No edema  Psychiatric: She has a normal mood and affect.  Nursing note and vitals reviewed.   ED Course  Procedures (including critical care time) Labs Review Labs Reviewed - No data to display  Imaging Review No results found.   EKG Interpretation None      MDM   Final diagnoses:  Cellulitis of left lower extremity   Previous chart reviewed.     Pt is  well appearing, non-toxic.  Vitals stable.  NV intact.  Ambulates with a steady gait.  Seen here 3 days ago for cellulitis of LLE.  Greatly improved.  Pt agrees to elevate, continue warm compresses and antibiotics as directed.  Advised to return here if sx's worsen  Pauline Aus, PA-C 09/30/14 1031  Doug Sou, MD 09/30/14 [phone]

## 2014-09-30 NOTE — ED Notes (Signed)
Tammy-PA at bedside at this time for evaluation. 

## 2014-10-02 ENCOUNTER — Telehealth (HOSPITAL_COMMUNITY): Payer: Self-pay

## 2014-10-02 NOTE — Telephone Encounter (Signed)
Post ED Visit - Positive Culture Follow-up  Culture report reviewed by antimicrobial stewardship pharmacist:  Wes Dulaney, Pharm.D., BCPS  Celedonio Miyamoto, [address].D., BCPS  Georgina Pillion, [address].D., BCPS  [place], [address].D., BCPS, AAHIVP  Estella Husk, Pharm.D., BCPS, AAHIVP  Elder Cyphers, [address].D., BCPS  Positive Urine culture>/= 100,000 colonies -> E Coli Treated with Cephalexin, organism sensitive to the same and no further patient follow-up is required at this time.  Arvid Right 10/02/2014, 5:05 AM

## 2014-10-03 LAB — CULTURE, BLOOD (ROUTINE X 2)
CULTURE: NO GROWTH
Culture: NO GROWTH

## 2015-03-14 ENCOUNTER — Other Ambulatory Visit: Payer: Self-pay | Admitting: Family Medicine

## 2015-03-14 DIAGNOSIS — Z1231 Encounter for screening mammogram for malignant neoplasm of breast: Secondary | ICD-10-CM

## 2015-03-22 ENCOUNTER — Ambulatory Visit: Payer: Medicaid Other

## 2015-03-27 ENCOUNTER — Ambulatory Visit
Admission: RE | Admit: 2015-03-27 | Discharge: 2015-03-27 | Disposition: A | Discharge status: Home / Self Care | Payer: Medicaid Other | Source: Ambulatory Visit | Attending: Family Medicine | Admitting: Family Medicine

## 2015-03-27 DIAGNOSIS — Z1231 Encounter for screening mammogram for malignant neoplasm of breast: Secondary | ICD-10-CM

## 2015-04-27 ENCOUNTER — Encounter (HOSPITAL_COMMUNITY): Payer: Self-pay | Admitting: *Deleted

## 2015-04-27 ENCOUNTER — Emergency Department (HOSPITAL_COMMUNITY): Payer: Medicaid Other

## 2015-04-27 ENCOUNTER — Emergency Department (HOSPITAL_COMMUNITY)
Admission: EM | Admit: 2015-04-27 | Discharge: 2015-04-27 | Disposition: A | Discharge status: Home / Self Care | Payer: Medicaid Other | Attending: Emergency Medicine | Admitting: Emergency Medicine

## 2015-04-27 DIAGNOSIS — W51XXXA Accidental striking against or bumped into by another person, initial encounter: Secondary | ICD-10-CM | POA: Diagnosis not present

## 2015-04-27 DIAGNOSIS — Z8742 Personal history of other diseases of the female genital tract: Secondary | ICD-10-CM | POA: Diagnosis not present

## 2015-04-27 DIAGNOSIS — S8012XA Contusion of left lower leg, initial encounter: Secondary | ICD-10-CM | POA: Insufficient documentation

## 2015-04-27 DIAGNOSIS — Z792 Long term (current) use of antibiotics: Secondary | ICD-10-CM | POA: Diagnosis not present

## 2015-04-27 DIAGNOSIS — Y9389 Activity, other specified: Secondary | ICD-10-CM | POA: Diagnosis not present

## 2015-04-27 DIAGNOSIS — R109 Unspecified abdominal pain: Secondary | ICD-10-CM | POA: Diagnosis present

## 2015-04-27 DIAGNOSIS — G8929 Other chronic pain: Secondary | ICD-10-CM | POA: Insufficient documentation

## 2015-04-27 DIAGNOSIS — Z8739 Personal history of other diseases of the musculoskeletal system and connective tissue: Secondary | ICD-10-CM | POA: Diagnosis not present

## 2015-04-27 DIAGNOSIS — Y9289 Other specified places as the place of occurrence of the external cause: Secondary | ICD-10-CM | POA: Insufficient documentation

## 2015-04-27 DIAGNOSIS — W548XXA Other contact with dog, initial encounter: Secondary | ICD-10-CM | POA: Insufficient documentation

## 2015-04-27 DIAGNOSIS — Y998 Other external cause status: Secondary | ICD-10-CM | POA: Diagnosis not present

## 2015-04-27 DIAGNOSIS — S30871A Other superficial bite of abdominal wall, initial encounter: Secondary | ICD-10-CM | POA: Insufficient documentation

## 2015-04-27 DIAGNOSIS — Z9049 Acquired absence of other specified parts of digestive tract: Secondary | ICD-10-CM | POA: Diagnosis not present

## 2015-04-27 DIAGNOSIS — R110 Nausea: Secondary | ICD-10-CM | POA: Insufficient documentation

## 2015-04-27 DIAGNOSIS — R51 Headache: Secondary | ICD-10-CM | POA: Diagnosis not present

## 2015-04-27 DIAGNOSIS — N390 Urinary tract infection, site not specified: Secondary | ICD-10-CM | POA: Insufficient documentation

## 2015-04-27 DIAGNOSIS — Z8619 Personal history of other infectious and parasitic diseases: Secondary | ICD-10-CM | POA: Insufficient documentation

## 2015-04-27 LAB — URINALYSIS, ROUTINE W REFLEX MICROSCOPIC
BILIRUBIN URINE: NEGATIVE
GLUCOSE, UA: NEGATIVE mg/dL
KETONES UR: NEGATIVE mg/dL
Nitrite: POSITIVE — AB
PH: 5.5 (ref 5.0–8.0)
PROTEIN: NEGATIVE mg/dL
Specific Gravity, Urine: 1.02 (ref 1.005–1.030)

## 2015-04-27 LAB — URINE MICROSCOPIC-ADD ON

## 2015-04-27 MED ORDER — CEPHALEXIN 500 MG PO CAPS
500.0000 mg | ORAL_CAPSULE | Freq: Once | ORAL | Status: AC
Start: 2015-04-27 — End: 2015-04-27
  Administered 2015-04-27: 500 mg via ORAL
  Filled 2015-04-27: qty 1

## 2015-04-27 MED ORDER — CEPHALEXIN 500 MG PO CAPS: 500.0000 mg | ORAL_CAPSULE | Freq: Four times a day (QID) | ORAL | Status: DC

## 2015-04-27 MED ORDER — KETOROLAC TROMETHAMINE 30 MG/ML IJ SOLN
30.0000 mg | Freq: Once | INTRAMUSCULAR | Status: AC
  Administered 2015-04-27: 30 mg via INTRAMUSCULAR
  Filled 2015-04-27: qty 1

## 2015-04-27 NOTE — ED Provider Notes (Signed)
CSN: [id number]     Arrival date & time 04/27/15  4098 History   First MD Initiated Contact with Patient 04/27/15 [phone]     No chief complaint on file.    (Consider location/radiation/quality/duration/timing/severity/associated sxs/prior Treatment) HPI  45 year old female with a history of chronic bladder infections who is normally on chronic antibiotics but has not been taking them for a week presents today complaining of left flank pain, frequency of urination, and nausea. She states the symptoms began 2 days ago. She has noted some blood in her urine. She has not had fever but has had some chills. She has been nauseated but has not had any vomiting and has been able take by mouth without difficulty. She goes to pain management. She has not taken anything besides her chronic medications for her pain. She has some headache but this has been chronic in nature. She denies any nasal congestion, sore throat, cough, dyspnea, or chest pain. She is on one occasion implanted birth control. She has not been having regular menstrual cycle since that was placed. She has had her gallbladder out and has had bladder repair in the past. She denies any abnormal vaginal discharge or bleeding.   Past Medical History  Diagnosis Date  . Arthritis   . Bulging discs   . Bladder infection   . Chronic UTI   . Pyelonephritis   . Uterine polyp   . Female bladder prolapse   . Yeast infection of the vagina   . Menorrhagia    Past Surgical History  Procedure Laterality Date  . Cholecystectomy    . Bladder surgery    . Cervical polypectomy    . Gynecologic cryosurgery    . Wrist surgery      Carpal Tunnel surgery  . Carpal tunnel release     Family History  Problem Relation Age of Onset  . Diabetes Father   . Hypertension Father   . Heart attack Neg Hx   . Hyperlipidemia Neg Hx    Social History  Substance Use Topics  . Smoking status: Never Smoker   . Smokeless tobacco: Current User    Types: Snuff  .  Alcohol Use: No   OB History    Gravida Para Term Preterm AB TAB SAB Ectopic Multiple Living   3 3 2 1      3      Review of Systems  All other systems reviewed and are negative.     Allergies  Aspirin; Hydrocodone; and Septra  Home Medications   Prior to Admission medications   Medication Sig Start Date End Date Taking? Authorizing Provider  cephALEXin (KEFLEX) 500 MG capsule Take 1 capsule (500 mg total) by mouth 4 (four) times daily. 09/27/14   Ivery Quale, PA-C  doxycycline (VIBRAMYCIN) 100 MG capsule Take 1 capsule (100 mg total) by mouth 2 (two) times daily. 09/27/14   Ivery Quale, PA-C  oxyCODONE-acetaminophen (PERCOCET) 10-325 MG per tablet Take 1 tablet by mouth every 6 (six) hours as needed for pain.    Historical Provider, MD   BP 103/90 mmHg  Pulse 109  Temp(Src) 97.7 F (36.5 C) (Oral)  Resp 18  Ht 5\' 5"  (1.651 m)  Wt 117.935 kg  BMI 43.27 kg/m2  SpO2 97% Physical Exam  Constitutional: She is oriented to person, place, and time. She appears well-developed and well-nourished. No distress.  Morbidly obese  HENT:  Head: Normocephalic and atraumatic.  Right Ear: External ear normal.  Left Ear: External ear normal.  Nose: Nose normal.  Tongue and lip with some darkish discoloration. Patient states that she has tobacco in her mouth.  Eyes: Conjunctivae and EOM are normal. Pupils are equal, round, and reactive to light.  Neck: Normal range of motion. Neck supple.  Cardiovascular: Normal rate, regular rhythm, normal heart sounds and intact distal pulses.   Pulmonary/Chest: Effort normal and breath sounds normal. No respiratory distress. She has no wheezes. She has no rales. She exhibits no tenderness.  Abdominal: Soft. Bowel sounds are normal. She exhibits no distension and no mass. There is no tenderness. There is no rebound and no guarding.  Musculoskeletal: Normal range of motion.  Neurological: She is alert and oriented to person, place, and time. She exhibits  normal muscle tone. Coordination normal.  Skin: Skin is warm and dry.  Small erythematous area right lower abdomen and patient states is from her dog jumping on her. Contusion noted left lower extremity medial aspect she states is present from a friend falling into her  Psychiatric: She has a normal mood and affect. Her behavior is normal. Judgment and thought content normal.  Nursing note and vitals reviewed.   ED Course  Procedures (including critical care time) Labs Review Labs Reviewed  URINALYSIS, ROUTINE W REFLEX MICROSCOPIC (NOT AT [facility]) - Abnormal; Notable for the following:    Hgb urine dipstick MODERATE (*)    Nitrite POSITIVE (*)    Leukocytes, UA SMALL (*)    All other components within normal limits  URINE MICROSCOPIC-ADD ON - Abnormal; Notable for the following:    Squamous Epithelial / LPF TOO NUMEROUS TO COUNT (*)    Bacteria, UA MANY (*)    All other components within normal limits    Imaging Review Ct Renal Stone Study  04/27/2015  CLINICAL DATA:  Nausea, low back pain, abdominal pain. Chronic kidney infections. Blood in urine. EXAM: CT ABDOMEN AND PELVIS WITHOUT CONTRAST TECHNIQUE: Multidetector CT imaging of the abdomen and pelvis was performed following the standard protocol without IV contrast. COMPARISON:  CT abdomen dated 06/24/2012. FINDINGS: Lower chest:  No acute findings. Hepatobiliary: Liver is low in density throughout suggesting fatty infiltration. Status post cholecystectomy. Pancreas: No mass or inflammatory process identified on this un-enhanced exam. Spleen: Within normal limits in size. Adrenals/Urinary Tract: Bilateral parapelvic renal cysts again noted, left greater than right. No renal stone or hydronephrosis bilaterally. No perinephric fluid. No ureteral or bladder calculi. Bladder is decompressed but otherwise unremarkable. Stomach/Bowel: Bowel is normal in caliber. No bowel wall thickening or evidence of bowel wall inflammation. Appendix is normal.  Vascular/Lymphatic: No pathologically enlarged lymph nodes. No evidence of abdominal aortic aneurysm. Reproductive: No mass or other significant abnormality. Question of a congenital septate/bicornuate uterine configuration. Adnexal regions are unremarkable. Other: No free fluid or abscess collection. No free intraperitoneal air. Musculoskeletal: No suspicious bone lesions identified. Degenerative changes throughout the thoracolumbar spine, mild to moderate in degree, most significant at the L5-S1 level where there is an associated disc bulge causing at least some degree of central canal stenosis and bilateral neural foramen narrowing. No acute osseous abnormality. Small periumbilical abdominal wall hernia which contains fat only. Superficial soft tissues otherwise unremarkable. IMPRESSION: 1. No evidence of acute intra-abdominal or intrapelvic abnormality. No renal stone or hydronephrosis. No ureteral or bladder calculi. No perinephric fluid. Bladder is decompressed but unremarkable. Incidental note made of stable bilateral parapelvic renal cysts. 2. Fatty infiltration of the liver. 3. Degenerative changes in the lower lumbar spine, as detailed above. No acute-appearing osseous  abnormality. 4. Additional chronic/incidental findings detailed above. Electronically Signed   By: Bary Richard M.D.   On: 04/27/2015 09:26   I have personally reviewed and evaluated these images and lab results as part of my medical decision-making.   MDM   Final diagnoses:  UTI (lower urinary tract infection)    Patient with left flank pain.  Urine c.w. Uti.  Plan keflex.  Patient advised regarding return precautions and need for follow up and voices understanding.     Margarita Grizzle, MD 04/27/15 1539

## 2015-04-27 NOTE — ED Notes (Signed)
States nausea, lower back pain, abdominal pain. Pt taking nitrofurantoin for chronic kidney infections. Pt states she had missed ~8 days before starting back yesterday. Symptoms began 2 days ago. Pt also states she has noticed blood in her urine. NAD at this time.

## 2015-04-27 NOTE — ED Notes (Signed)
MD at bedside. 

## 2015-04-27 NOTE — ED Notes (Signed)
Patient with no complaints at this time. Respirations even and unlabored. Skin warm/dry. Discharge instructions reviewed with patient at this time. Patient given opportunity to voice concerns/ask questions. Patient discharged at this time and left Emergency Department with steady gait.   

## 2015-04-27 NOTE — Discharge Instructions (Signed)

## 2015-05-21 ENCOUNTER — Emergency Department (HOSPITAL_COMMUNITY)
Admission: EM | Admit: 2015-05-21 | Discharge: 2015-05-21 | Disposition: A | Discharge status: Home / Self Care | Payer: Medicaid Other | Attending: Emergency Medicine | Admitting: Emergency Medicine

## 2015-05-21 ENCOUNTER — Encounter (HOSPITAL_COMMUNITY): Payer: Self-pay | Admitting: Emergency Medicine

## 2015-05-21 DIAGNOSIS — F17290 Nicotine dependence, other tobacco product, uncomplicated: Secondary | ICD-10-CM | POA: Diagnosis not present

## 2015-05-21 DIAGNOSIS — B349 Viral infection, unspecified: Secondary | ICD-10-CM

## 2015-05-21 DIAGNOSIS — M791 Myalgia: Secondary | ICD-10-CM | POA: Diagnosis present

## 2015-05-21 LAB — URINALYSIS, ROUTINE W REFLEX MICROSCOPIC
Bilirubin Urine: NEGATIVE
GLUCOSE, UA: NEGATIVE mg/dL
KETONES UR: NEGATIVE mg/dL
Leukocytes, UA: NEGATIVE
Nitrite: NEGATIVE
PROTEIN: NEGATIVE mg/dL
Specific Gravity, Urine: 1.005 (ref 1.005–1.030)
pH: 8 (ref 5.0–8.0)

## 2015-05-21 LAB — URINE MICROSCOPIC-ADD ON

## 2015-05-21 LAB — POC URINE PREG, ED: PREG TEST UR: NEGATIVE

## 2015-05-21 MED ORDER — FAMOTIDINE 20 MG PO TABS
20.0000 mg | ORAL_TABLET | Freq: Once | ORAL | Status: AC
Start: 2015-05-21 — End: 2015-05-21
  Administered 2015-05-21: 20 mg via ORAL
  Filled 2015-05-21: qty 1

## 2015-05-21 MED ORDER — OSELTAMIVIR PHOSPHATE 75 MG PO CAPS: 75.0000 mg | ORAL_CAPSULE | Freq: Two times a day (BID) | ORAL | Status: DC

## 2015-05-21 MED ORDER — IPRATROPIUM-ALBUTEROL 0.5-2.5 (3) MG/3ML IN SOLN
3.0000 mL | Freq: Once | RESPIRATORY_TRACT | Status: AC
  Administered 2015-05-21: 3 mL via RESPIRATORY_TRACT
  Filled 2015-05-21: qty 3

## 2015-05-21 MED ORDER — ALBUTEROL SULFATE (2.5 MG/3ML) 0.083% IN NEBU
2.5000 mg | INHALATION_SOLUTION | Freq: Once | RESPIRATORY_TRACT | Status: AC
  Administered 2015-05-21: 2.5 mg via RESPIRATORY_TRACT
  Filled 2015-05-21: qty 3

## 2015-05-21 MED ORDER — BENZONATATE 100 MG PO CAPS: 200.0000 mg | ORAL_CAPSULE | Freq: Three times a day (TID) | ORAL | Status: DC | PRN

## 2015-05-21 MED ORDER — IBUPROFEN 800 MG PO TABS
800.0000 mg | ORAL_TABLET | Freq: Once | ORAL | Status: AC
  Administered 2015-05-21: 800 mg via ORAL
  Filled 2015-05-21: qty 1

## 2015-05-21 NOTE — ED Provider Notes (Signed)
CSN: [id number]     Arrival date & time 05/21/15  1631 History  By signing my name below, I, Doreatha Martin, attest that this documentation has been prepared under the direction and in the presence of Haizel Gatchell, PA-C. Electronically Signed: Doreatha Martin, ED Scribe. 05/21/2015. 6:03 PM.    Chief Complaint  Patient presents with  . Generalized Body Aches   The history is provided by the patient. No language interpreter was used.   HPI Comments: Cathy Rowland is a 45 y.o. female who presents to the Emergency Department complaining of moderate generalized myalgias onset yesterday with associated subjective fever, nasal congestion, lower back pain, ear pain, dry cough, wheezing, rhinorrhea, chest tightness secondary to cough. Pt denies taking OTC medications at home to improve symptoms. Pt states sick contact with husband, who had similar symptoms this week. NKDA. Pt states she takes Macrobid daily for chronic UTI. She has a proventil inhaler at home which she used prior to arrival. Denies dysuria, SOB, abdominal pain, chest pain, nausea, emesis, diarrhea.   Past Medical History  Diagnosis Date  . Arthritis   . Bulging discs   . Bladder infection   . Chronic UTI   . Pyelonephritis   . Uterine polyp   . Female bladder prolapse   . Yeast infection of the vagina   . Menorrhagia    Past Surgical History  Procedure Laterality Date  . Cholecystectomy    . Bladder surgery    . Cervical polypectomy    . Gynecologic cryosurgery    . Wrist surgery      Carpal Tunnel surgery  . Carpal tunnel release     Family History  Problem Relation Age of Onset  . Diabetes Father   . Hypertension Father   . Heart attack Neg Hx   . Hyperlipidemia Neg Hx    Social History  Substance Use Topics  . Smoking status: Never Smoker   . Smokeless tobacco: Current User    Types: Snuff  . Alcohol Use: No   OB History    Gravida Para Term Preterm AB TAB SAB Ectopic Multiple Living   Review of Systems  Constitutional: Positive for fever.  HENT: Positive for congestion, ear pain and rhinorrhea.   Respiratory: Positive for cough, chest tightness and wheezing. Negative for shortness of breath.   Gastrointestinal: Negative for nausea, vomiting, abdominal pain and diarrhea.  Genitourinary: Negative for dysuria.  Musculoskeletal: Positive for myalgias ( generalized) and back pain.   Allergies  Aspirin; Hydrocodone; and Septra  Home Medications   Prior to Admission medications   Medication Sig Start Date End Date Taking? Authorizing Provider  ALPRAZolam Prudy Feeler) 0.5 MG tablet Take 0.5 mg by mouth at bedtime.    Historical Provider, MD  cephALEXin (KEFLEX) 500 MG capsule Take 1 capsule (500 mg total) by mouth 4 (four) times daily. 04/27/15   Margarita Grizzle, MD  nitrofurantoin (MACRODANTIN) 50 MG capsule Take 50 mg by mouth daily.    Historical Provider, MD  oxyCODONE-acetaminophen (PERCOCET) 10-325 MG per tablet Take 1 tablet by mouth every 6 (six) hours as needed for pain.    Historical Provider, MD  PROAIR HFA 108 [phone] Base) MCG/ACT inhaler Inhale 2 puffs into the lungs every 4 (four) hours as needed for wheezing or shortness of breath.  03/14/15   Historical Provider, MD   BP 156/99 mmHg  Pulse 123  Temp(Src) 100 F (37.8  C) (Oral)  Resp 18  Ht 5\' 4"  (1.626 m)  Wt 279 lb (126.554 kg)  BMI 47.87 kg/m2  SpO2 95% Physical Exam  Constitutional: She is oriented to person, place, and time. She appears well-developed and well-nourished.  HENT:  Head: Normocephalic and atraumatic.  Eyes: Conjunctivae and EOM are normal. Pupils are equal, round, and reactive to light.  Neck: Normal range of motion. Neck supple.  Cardiovascular: Normal rate, regular rhythm and normal heart sounds.  Exam reveals no gallop and no friction rub.   No murmur heard. Pulmonary/Chest: Effort normal. No respiratory distress. She has wheezes ( few scattered wheezes. ). She has no rales.  Abdominal:  Soft. She exhibits no distension. There is no tenderness.  Musculoskeletal: Normal range of motion.  Neurological: She is alert and oriented to person, place, and time.  Skin: Skin is warm and dry.  Psychiatric: She has a normal mood and affect. Her behavior is normal.  Nursing note and vitals reviewed.   ED Course  Procedures (including critical care time) DIAGNOSTIC STUDIES: Oxygen Saturation is 95% on RA, adequate by my interpretation.    COORDINATION OF CARE: 5:55 PM Discussed treatment plan with pt at bedside which includes symptomatic therapy and pt agreed to plan.   MDM   Final diagnoses:  Viral illness   Pt is non-toxic.  Talking with family members at bedside and dipping snuff.  Patient has low grade fever and tachycardia.  She admits to using albuterol prior to arrival and had neb tx here.  She has drank several cups of water here and tachycardia improved.  Her sx's are c/w viral illness and daughter also seen here for similar sx's. Low clinical suspicion for PE.   Patient appears stable for d/c, she agrees to increase fluid intake for 2-3 days and close PMD f/u.  Return precautions given.     I personally performed the services described in this documentation, which was scribed in my presence. The recorded information has been reviewed and is accurate.   Pauline Ausammy Braxxton Stoudt, PA-C 05/23/15 2349  Benjiman CoreNathan Pickering, MD 05/23/15 [phone]

## 2015-05-21 NOTE — ED Notes (Signed)
Pt states she has been having aches, coughing, sneezing, and runny nose for the past few days.

## 2015-05-21 NOTE — ED Notes (Signed)
Pt states understanding of care given and follow up instructions.  Ambulated from ED with significant other 

## 2015-05-23 LAB — URINE CULTURE

## 2015-10-12 ENCOUNTER — Encounter (HOSPITAL_COMMUNITY): Payer: Self-pay

## 2015-10-12 ENCOUNTER — Emergency Department (HOSPITAL_COMMUNITY)
Admission: EM | Admit: 2015-10-12 | Discharge: 2015-10-12 | Disposition: A | Discharge status: Home / Self Care | Payer: Medicaid Other | Attending: Emergency Medicine | Admitting: Emergency Medicine

## 2015-10-12 DIAGNOSIS — Y999 Unspecified external cause status: Secondary | ICD-10-CM | POA: Insufficient documentation

## 2015-10-12 DIAGNOSIS — X58XXXA Exposure to other specified factors, initial encounter: Secondary | ICD-10-CM | POA: Insufficient documentation

## 2015-10-12 DIAGNOSIS — S39011A Strain of muscle, fascia and tendon of abdomen, initial encounter: Secondary | ICD-10-CM | POA: Insufficient documentation

## 2015-10-12 DIAGNOSIS — Z792 Long term (current) use of antibiotics: Secondary | ICD-10-CM | POA: Insufficient documentation

## 2015-10-12 DIAGNOSIS — Y939 Activity, unspecified: Secondary | ICD-10-CM | POA: Insufficient documentation

## 2015-10-12 DIAGNOSIS — T148XXA Other injury of unspecified body region, initial encounter: Secondary | ICD-10-CM

## 2015-10-12 DIAGNOSIS — Y929 Unspecified place or not applicable: Secondary | ICD-10-CM | POA: Insufficient documentation

## 2015-10-12 DIAGNOSIS — F17290 Nicotine dependence, other tobacco product, uncomplicated: Secondary | ICD-10-CM | POA: Insufficient documentation

## 2015-10-12 DIAGNOSIS — Z79899 Other long term (current) drug therapy: Secondary | ICD-10-CM | POA: Insufficient documentation

## 2015-10-12 LAB — URINALYSIS, ROUTINE W REFLEX MICROSCOPIC
BILIRUBIN URINE: NEGATIVE
Glucose, UA: NEGATIVE mg/dL
KETONES UR: NEGATIVE mg/dL
LEUKOCYTES UA: NEGATIVE
NITRITE: NEGATIVE
PH: 5.5 (ref 5.0–8.0)
Protein, ur: NEGATIVE mg/dL
SPECIFIC GRAVITY, URINE: 1.02 (ref 1.005–1.030)

## 2015-10-12 LAB — URINE MICROSCOPIC-ADD ON

## 2015-10-12 MED ORDER — PREDNISONE 20 MG PO TABS: 20.0000 mg | ORAL_TABLET | Freq: Two times a day (BID) | ORAL | 0 refills | Status: DC

## 2015-10-12 NOTE — ED Triage Notes (Signed)
Right flank pain since Monday. States she has chronic UTIs with no symptoms. Currently taking Nitrofurantonin Macro - has been taking for 5 months.

## 2015-10-12 NOTE — Discharge Instructions (Signed)
Use heat on the sore area 3 or 4 times a day.  Follow-up with her primary care doctor for checkup next week.  Avoid lifting, or bending, until you feel better.

## 2015-10-12 NOTE — ED Notes (Signed)
EDP at bedside  

## 2015-10-12 NOTE — ED Provider Notes (Signed)
[facility]-EMERGENCY DEPT Provider Note   CSN: [id number] Arrival date & time: 10/12/15  1308     History   Chief Complaint Chief Complaint  Patient presents with  . Flank Pain    HPI Cathy Rowland is a 45 y.o. female.  She presents for evaluation of right flank pain which has been present day and night for 3 days. No known trauma. No dysuria, urinary frequency, hematuria, fever, chills, nausea or vomiting. No constipation. She thought she might have a UTI. She is on chronic suppression for frequent UTIs. No other known modifying factors.   HPI  Past Medical History:  Diagnosis Date  . Arthritis   . Bladder infection   . Bulging discs   . Chronic UTI   . Female bladder prolapse   . Menorrhagia   . Pyelonephritis   . Uterine polyp   . Yeast infection of the vagina     Patient Active Problem List   Diagnosis Date Noted  . Pain in limb 06/10/2013  . Swelling of limb 06/10/2013  . Right shoulder pain 07/19/2011  . Carpal tunnel syndrome, right 07/19/2011  . ACUTE BRONCHITIS 04/02/2010  . FLANK PAIN, RIGHT 04/02/2010    Past Surgical History:  Procedure Laterality Date  . BLADDER SURGERY    . CARPAL TUNNEL RELEASE    . CERVICAL POLYPECTOMY    . CHOLECYSTECTOMY    . GYNECOLOGIC CRYOSURGERY    . WRIST SURGERY     Carpal Tunnel surgery    OB History    Gravida Para Term Preterm AB Living   3 3 2 1   3    SAB TAB Ectopic Multiple Live Births                   Home Medications    Prior to Admission medications   Medication Sig Start Date End Date Taking? Authorizing Provider  ALPRAZolam Prudy Feeler(XANAX) 1 MG tablet Take 1 mg by mouth at bedtime as needed for anxiety or sleep.   Yes Historical Provider, MD  nitrofurantoin (MACRODANTIN) 50 MG capsule Take 50 mg by mouth daily.   Yes Historical Provider, MD  oxyCODONE-acetaminophen (PERCOCET) 10-325 MG per tablet Take 1 tablet by mouth every 6 (six) hours as needed for pain.   Yes Historical Provider, MD  PROAIR HFA 108  [phone](90 Base) MCG/ACT inhaler Inhale 2 puffs into the lungs every 4 (four) hours as needed for wheezing or shortness of breath.  03/14/15  Yes Historical Provider, MD  predniSONE (DELTASONE) 20 MG tablet Take 1 tablet (20 mg total) by mouth 2 (two) times daily. 10/12/15   Mancel BaleElliott Rajohn Henery, MD    Family History Family History  Problem Relation Age of Onset  . Diabetes Father   . Hypertension Father   . Heart attack Neg Hx   . Hyperlipidemia Neg Hx     Social History Social History  Substance Use Topics  . Smoking status: Never Smoker  . Smokeless tobacco: Current User    Types: Snuff  . Alcohol use No     Allergies   Aspirin; Cyclobenzaprine; Hydrocodone; Mesalamine; Septra [sulfamethoxazole-trimethoprim]; and Latex   Review of Systems Review of Systems  All other systems reviewed and are negative.    Physical Exam Updated Vital Signs BP 132/78   Pulse 84   Temp 98.2 F (36.8 C) (Oral)   Resp 16   Ht 5\' 4"  (1.626 m)   Wt 276 lb (125.2 kg)   SpO2 100%   BMI 47.38 kg/m  Physical Exam  Constitutional: She is oriented to person, place, and time. She appears well-developed.  Morbidly obese  HENT:  Head: Normocephalic and atraumatic.  Eyes: Conjunctivae and EOM are normal. Pupils are equal, round, and reactive to light.  Neck: Normal range of motion and phonation normal. Neck supple.  Cardiovascular: Normal rate and regular rhythm.   Pulmonary/Chest: Effort normal and breath sounds normal. She exhibits no tenderness.  Abdominal: Soft. She exhibits no distension. There is no tenderness. There is no guarding.  Musculoskeletal: Normal range of motion.  Tender right flank, to light touch.  Neurological: She is alert and oriented to person, place, and time. She exhibits normal muscle tone.  Skin: Skin is warm and dry.  Psychiatric: She has a normal mood and affect. Her behavior is normal. Judgment and thought content normal.  Nursing note and vitals reviewed.    ED  Treatments / Results  Labs (all labs ordered are listed, but only abnormal results are displayed) Labs Reviewed  URINALYSIS, ROUTINE W REFLEX MICROSCOPIC (NOT [facility]) - Abnormal; Notable for the following:       Result Value   Hgb urine dipstick TRACE (*)    All other components within normal limits  URINE MICROSCOPIC-ADD ON - Abnormal; Notable for the following:    Squamous Epithelial / LPF TOO NUMEROUS TO COUNT (*)    Bacteria, UA FEW (*)    All other components within normal limits    EKG  EKG Interpretation None       Radiology No results found.  Procedures Procedures (including critical care time)  Medications Ordered in ED Medications - No data to display   Initial Impression / Assessment and Plan / ED Course  I have reviewed the triage vital signs and the nursing notes.  Pertinent labs & imaging results that were available during my care of the patient were reviewed by me and considered in my medical decision making (see chart for details).  Clinical Course    Medications - No data to display  Patient Vitals for the past 24 hrs:  BP Temp Temp src Pulse Resp SpO2 Height Weight  10/12/15 1444 132/78 98.2 F (36.8 C) Oral 84 16 100 % - -  10/12/15 1317 120/73 98.3 F (36.8 C) Oral 91 18 99 % 5\' 4"  (1.626 m) 276 lb (125.2 kg)    2:45 PM Reevaluation with update and discussion. After initial assessment and treatment, an updated evaluation reveals No change in clinical status. Findings discussed with patient and husband, all questions answered. Cathy Rowland L    Final Clinical Impressions(s) / ED Diagnoses   Final diagnoses:  Muscle strain   Evaluation consistent with musculoskeletal strain. Possible radicular component. Doubt HNP, or spinal myelopathy.  Nursing Notes Reviewed/ Care Coordinated Applicable Imaging Reviewed Interpretation of Laboratory Data incorporated into ED treatment  The patient appears reasonably screened and/or stabilized for  discharge and I doubt any other medical condition or other [facility] requiring further screening, evaluation, or treatment in the ED at this time prior to discharge.  Plan: Home Medications- continue; Home Treatments- rest, heat; return here if the recommended treatment, does not improve the symptoms; Recommended follow up- PCP 1 week    New Prescriptions Discharge Medication List as of 10/12/2015  2:40 PM    START taking these medications   Details  predniSONE (DELTASONE) 20 MG tablet Take 1 tablet (20 mg total) by mouth 2 (two) times daily., Starting Thu 10/12/2015, Print  Mancel Bale, MD 10/12/15 1447

## 2016-03-25 ENCOUNTER — Other Ambulatory Visit: Payer: Self-pay | Admitting: Family Medicine

## 2016-03-25 DIAGNOSIS — Z1231 Encounter for screening mammogram for malignant neoplasm of breast: Secondary | ICD-10-CM

## 2016-04-09 ENCOUNTER — Ambulatory Visit: Payer: Medicaid Other

## 2016-12-25 ENCOUNTER — Other Ambulatory Visit: Payer: Self-pay

## 2016-12-25 DIAGNOSIS — M79609 Pain in unspecified limb: Secondary | ICD-10-CM

## 2017-02-06 ENCOUNTER — Ambulatory Visit (HOSPITAL_COMMUNITY)
Admission: RE | Admit: 2017-02-06 | Discharge: 2017-02-06 | Disposition: A | Discharge status: Home / Self Care | Payer: PRIVATE HEALTH INSURANCE | Source: Ambulatory Visit | Attending: Vascular Surgery | Admitting: Vascular Surgery

## 2017-02-06 ENCOUNTER — Encounter: Payer: Self-pay | Admitting: Vascular Surgery

## 2017-02-06 ENCOUNTER — Ambulatory Visit (INDEPENDENT_AMBULATORY_CARE_PROVIDER_SITE_OTHER): Payer: PRIVATE HEALTH INSURANCE | Admitting: Vascular Surgery

## 2017-02-06 VITALS — BP 135/90 | HR 95 | Temp 98.4°F | Resp 16 | Ht 64.0 in | Wt 264.0 lb

## 2017-02-06 DIAGNOSIS — M79609 Pain in unspecified limb: Secondary | ICD-10-CM | POA: Insufficient documentation

## 2017-02-06 DIAGNOSIS — M79604 Pain in right leg: Secondary | ICD-10-CM | POA: Diagnosis not present

## 2017-02-06 NOTE — Progress Notes (Signed)
Patient is a 46 year old female who returns for follow-up today.  She was last seen in 2015.  She was complaining of pain in her legs at that point.  She still has a cramping sensation that occurs in her right calf intermittently.  This does not always associate with walking.  She denies rest pain.  She has no nonhealing wounds.  He does chew tobacco.  Trying to quit.  Current Outpatient Medications on File Prior to Visit  Medication Sig Dispense Refill  . ALPRAZolam (XANAX) 1 MG tablet Take 1 mg by mouth at bedtime as needed for anxiety or sleep.    . nitrofurantoin (MACRODANTIN) 50 MG capsule Take 50 mg by mouth daily.    Marland Kitchen. oxyCODONE-acetaminophen (PERCOCET) 10-325 MG per tablet Take 1 tablet by mouth every 6 (six) hours as needed for pain.    Marland Kitchen. PROAIR HFA 108 (90 Base) MCG/ACT inhaler Inhale 2 puffs into the lungs every 4 (four) hours as needed for wheezing or shortness of breath.   11   No current facility-administered medications on file prior to visit.      Past Surgical History:  Procedure Laterality Date  . BLADDER SURGERY    . CARPAL TUNNEL RELEASE    . CERVICAL POLYPECTOMY    . CHOLECYSTECTOMY    . GYNECOLOGIC CRYOSURGERY    . WRIST SURGERY     Carpal Tunnel surgery     Past Medical History:  Diagnosis Date  . Arthritis   . Bladder infection   . Bulging discs   . Chronic UTI   . Female bladder prolapse   . Menorrhagia   . Pyelonephritis   . Uterine polyp   . Yeast infection of the vagina    Review of systems: She has shortness of breath with exertion.  She chest pain.  Physical exam:  Vitals:   02/06/17 1014  BP: 135/90  Pulse: 95  Resp: 16  Temp: 98.4 F (36.9 C)  TempSrc: Oral  SpO2: 99%  Weight: 264 lb (119.7 kg)  Height: 5\' 4"  (1.626 m)    Extremities: 2+ posterior tibial pulses bilaterally.  No palpable dorsalis pedis pulse.  Skin: No ulcers no rash    Data: Patient had bilateral ABIs performed today which were triphasic greater than 1 and  normal bilaterally.  Digit pressure was greater than 100 bilaterally.  Assessment: Right leg pain no evidence of arterial occlusive disease.  Most likely musculoskeletal in origin.  Plan: The patient will continue to try to quit using tobacco products.  She will follow-up with me on as-needed basis.  Fabienne Brunsharles Tylena Prisk, MD [facility] Office: [phone] Pager: [phone]

## 2018-01-03 ENCOUNTER — Encounter (HOSPITAL_BASED_OUTPATIENT_CLINIC_OR_DEPARTMENT_OTHER): Payer: Self-pay | Admitting: *Deleted

## 2018-01-03 ENCOUNTER — Emergency Department (HOSPITAL_BASED_OUTPATIENT_CLINIC_OR_DEPARTMENT_OTHER): Payer: Self-pay

## 2018-01-03 ENCOUNTER — Other Ambulatory Visit: Payer: Self-pay

## 2018-01-03 ENCOUNTER — Emergency Department (HOSPITAL_BASED_OUTPATIENT_CLINIC_OR_DEPARTMENT_OTHER)
Admission: EM | Admit: 2018-01-03 | Discharge: 2018-01-03 | Disposition: A | Discharge status: Home / Self Care | Payer: Self-pay | Attending: Emergency Medicine | Admitting: Emergency Medicine

## 2018-01-03 DIAGNOSIS — Z91040 Latex allergy status: Secondary | ICD-10-CM | POA: Insufficient documentation

## 2018-01-03 DIAGNOSIS — Z79899 Other long term (current) drug therapy: Secondary | ICD-10-CM | POA: Insufficient documentation

## 2018-01-03 DIAGNOSIS — N3001 Acute cystitis with hematuria: Secondary | ICD-10-CM | POA: Insufficient documentation

## 2018-01-03 LAB — URINALYSIS, ROUTINE W REFLEX MICROSCOPIC
Bilirubin Urine: NEGATIVE
Glucose, UA: NEGATIVE mg/dL
KETONES UR: NEGATIVE mg/dL
Nitrite: POSITIVE — AB
PROTEIN: NEGATIVE mg/dL
Specific Gravity, Urine: 1.015 (ref 1.005–1.030)
pH: 6.5 (ref 5.0–8.0)

## 2018-01-03 LAB — URINALYSIS, MICROSCOPIC (REFLEX): WBC, UA: >50 WBC/hpf (ref 0–5)

## 2018-01-03 LAB — PREGNANCY, URINE: Preg Test, Ur: NEGATIVE

## 2018-01-03 MED ORDER — PHENAZOPYRIDINE HCL 100 MG PO TABS
200.0000 mg | ORAL_TABLET | Freq: Once | ORAL | Status: AC
  Administered 2018-01-03: 200 mg via ORAL
  Filled 2018-01-03: qty 2

## 2018-01-03 MED ORDER — ONDANSETRON 4 MG PO TBDP: 4.0000 mg | ORAL_TABLET | Freq: Three times a day (TID) | ORAL | 0 refills | Status: DC | PRN

## 2018-01-03 MED ORDER — CEPHALEXIN 500 MG PO CAPS: 500.0000 mg | ORAL_CAPSULE | Freq: Two times a day (BID) | ORAL | 0 refills | Status: DC

## 2018-01-03 MED ORDER — PHENAZOPYRIDINE HCL 200 MG PO TABS: 200.0000 mg | ORAL_TABLET | Freq: Three times a day (TID) | ORAL | 0 refills | Status: DC

## 2018-01-03 MED ORDER — CEPHALEXIN 250 MG PO CAPS
500.0000 mg | ORAL_CAPSULE | Freq: Once | ORAL | Status: AC
  Administered 2018-01-03: 500 mg via ORAL
  Filled 2018-01-03: qty 2

## 2018-01-03 NOTE — ED Provider Notes (Signed)
[facility] EMERGENCY DEPARTMENT Provider Note   CSN: [id number] Arrival date & time: 01/03/18  1735     History   Chief Complaint Chief Complaint  Patient presents with  . Flank Pain    HPI Cathy Rowland is a 47 y.o. 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  She states that it feels like when she has a UTI.  She reports that she has been compliant with her home nitrofurantoin with no improvement in her symptoms.  She reports that over the last day she began to develop blood in her urine and urinary frequency.  She denies nausea, vomiting, constipation, diarrhea, vaginal pain, itching, bleeding, or discharge, fever, chills, chest pain, dyspnea.  She has been taking her home pain medication for her chronic low back pain with improvement.  Surgical History includes cholecystectomy.  The history is provided by the patient. No language interpreter was used.    Past Medical History:  Diagnosis Date  . Arthritis   . Bladder infection   . Bulging discs   . Chronic UTI   . Female bladder prolapse   . Menorrhagia   . Pyelonephritis   . Uterine polyp   . Yeast infection of the vagina     Patient Active Problem List   Diagnosis Date Noted  . Pain in limb 06/10/2013  . Swelling of limb 06/10/2013  . Right shoulder pain 07/19/2011  . Carpal tunnel syndrome, right 07/19/2011  . ACUTE BRONCHITIS 04/02/2010  . FLANK PAIN, RIGHT 04/02/2010    Past Surgical History:  Procedure Laterality Date  . BLADDER SURGERY    . CARPAL TUNNEL RELEASE    . CERVICAL POLYPECTOMY    . CHOLECYSTECTOMY    . GYNECOLOGIC CRYOSURGERY    . WRIST SURGERY     Carpal Tunnel surgery     OB  History    Gravida  3   Para  3   Term  2   Preterm  1   AB      Living  3     SAB      TAB      Ectopic      Multiple      Live Births               Home Medications    Prior to Admission medications   Medication Sig Start Date End Date Taking? Authorizing Provider  ALPRAZolam Prudy Feeler) 1 MG tablet Take 1 mg by mouth at bedtime as needed for anxiety or sleep.   Yes [provider]  oxyCODONE-acetaminophen (PERCOCET) 10-325 MG per tablet Take 1 tablet by mouth every 6 (six) hours as needed for pain.   Yes [provider]  PROAIR HFA 108 (90 Base) MCG/ACT inhaler Inhale 2 puffs into the lungs every 4 (four) hours as needed for wheezing or shortness of breath.  03/14/15  Yes [provider]  cephALEXin (KEFLEX) 500 MG capsule Take 1 capsule (500 mg total) by mouth 2 (two) times daily. 01/03/18   McDonald, Mia A, PA-C  nitrofurantoin (MACRODANTIN) 50 MG capsule Take 50 mg by mouth daily.    [provider]  ondansetron (ZOFRAN ODT) 4 MG disintegrating tablet Take 1 tablet (  4 mg total) by mouth every 8 (eight) hours as needed for nausea or vomiting. 01/03/18   McDonald, Mia A, PA-C  phenazopyridine (PYRIDIUM) 200 MG tablet Take 1 tablet (200 mg total) by mouth 3 (three) times daily with meals. 01/03/18   McDonald, Mia A, PA-C    Family History Family History  Problem Relation Age of Onset  . Diabetes Father   . Hypertension Father   . Heart attack Neg Hx   . Hyperlipidemia Neg Hx     Social History Social History   Tobacco Use  . Smoking status: Never Smoker  . Smokeless tobacco: Current User    Types: Snuff  . Tobacco comment: 1 box every 2-3 days.  Substance Use Topics  . Alcohol use: No  . Drug use: No     Allergies   Aspirin; Cyclobenzaprine; Hydrocodone; Mesalamine; Septra [sulfamethoxazole-trimethoprim]; and Latex   Review of Systems Review of Systems  Constitutional: Negative for activity change, chills and  fever.  HENT: Negative for congestion.   Respiratory: Negative for shortness of breath.   Cardiovascular: Negative for chest pain and palpitations.  Gastrointestinal: Negative for abdominal pain, diarrhea, nausea and vomiting.  Genitourinary: Positive for flank pain, frequency and hematuria. Negative for dysuria, menstrual problem, urgency, vaginal bleeding, vaginal discharge and vaginal pain.  Musculoskeletal: Negative for back pain, neck pain and neck stiffness.  Skin: Negative for rash.  Allergic/Immunologic: Negative for immunocompromised state.  Neurological: Negative for weakness, numbness and headaches.  Psychiatric/Behavioral: Negative for confusion.   Physical Exam Updated Vital Signs BP (!) 142/85 (BP Location: Right Arm)   Pulse 77   Temp 98.4 F (36.9 C) (Oral)   Resp 18   Ht 5\' 4"  (1.626 m)   Wt 121.1 kg   SpO2 99%   BMI 45.83 kg/m   Physical Exam  Constitutional: No distress.  Well-appearing.  No acute distress.  HENT:  Head: Normocephalic.  Eyes: Conjunctivae are normal.  Neck: Neck supple.  Cardiovascular: Normal rate, regular rhythm, normal heart sounds and intact distal pulses. Exam reveals no gallop and no friction rub.  No murmur heard. Pulmonary/Chest: Effort normal. No stridor. No respiratory distress. She has no wheezes. She has no rales. She exhibits no tenderness.  Abdominal: Soft. Bowel sounds are normal. She exhibits no distension and no mass. There is tenderness. There is no rebound and no guarding. No hernia.  Tender to palpation in the right lower quadrant and suprapubic region with right CVA tenderness.  No rebound or guarding.  Abdomen is soft, nondistended.  No peritoneal signs.  No tenderness over McBurney's point.  Neurological: She is alert.  Skin: Skin is warm. No rash noted.  Psychiatric: Her behavior is normal.  Nursing note and vitals reviewed.    ED Treatments / Results  Labs (all labs ordered are listed, but only abnormal results  are displayed) Labs Reviewed  URINALYSIS, ROUTINE W REFLEX MICROSCOPIC - Abnormal; Notable for the following components:      Result Value   APPearance TURBID (*)    Hgb urine dipstick TRACE (*)    Nitrite POSITIVE (*)    Leukocytes, UA MODERATE (*)    All other components within normal limits  URINALYSIS, MICROSCOPIC (REFLEX) - Abnormal; Notable for the following components:   Bacteria, UA MANY (*)    All other components within normal limits  URINE CULTURE  PREGNANCY, URINE    EKG None  Radiology Ct Renal Stone Study  Result Date: 01/03/2018 CLINICAL DATA:  Right flank pain  and hematuria for 2 weeks. EXAM: CT ABDOMEN AND PELVIS WITHOUT CONTRAST TECHNIQUE: Multidetector CT imaging of the abdomen and pelvis was performed following the standard protocol without IV contrast. COMPARISON:  04/27/2015 FINDINGS: Lower chest: The lung bases are clear of acute process. No pleural effusion or pulmonary lesions. The heart is normal in size. No pericardial effusion. The distal esophagus and aorta are unremarkable. Hepatobiliary: No focal hepatic lesions or intrahepatic biliary dilatation. Small calcified granuloma is stable. The gallbladder surgically absent. No common bile duct dilatation. Pancreas: No mass, inflammation or ductal dilatation. Spleen: Normal size.  No focal lesions. Adrenals/Urinary Tract: The adrenal glands are normal in stable. The right kidney is small but demonstrates normal renal cortical thickness. There are areas of scarring. No right-sided renal or ureteral calculi. The left kidney demonstrates large parapelvic cysts. No hydroureteronephrosis. No ureteral calculi. The bladder is unremarkable.  No bladder mass or calculi. Stomach/Bowel: The stomach, duodenum, small bowel and colon are unremarkable. No acute inflammatory changes, mass lesions or obstructive findings. The terminal ileum and appendix are normal. Vascular/Lymphatic: Scattered atherosclerotic calcifications involving  the aorta. No mesenteric or retroperitoneal mass or adenopathy. Reproductive: Small uterine fibroids are noted. The ovaries appear normal. Other: No pelvic mass or adenopathy. No free pelvic fluid collections. No inguinal mass or adenopathy. No abdominal wall hernia or subcutaneous lesions. Musculoskeletal: No significant bony findings. Moderate degenerate disc disease noted at L5-S1. IMPRESSION: 1. No renal or obstructing ureteral calculi or bladder calculi. 2. No worrisome renal or bladder lesions without contrast. 3. Stable large left-sided parapelvic cysts. 4. The right kidney is small and demonstrates some scarring changes. 5. No acute abdominal/pelvic findings, mass lesions or lymphadenopathy. Electronically Signed   By: Rudie Meyer M.D.   On: 01/03/2018 19:40    Procedures Procedures (including critical care time)  Medications Ordered in ED Medications  cephALEXin (KEFLEX) capsule 500 mg (500 mg Oral Given 01/03/18 2024)  phenazopyridine (PYRIDIUM) tablet 200 mg (200 mg Oral Given 01/03/18 2024)     Initial Impression / Assessment and Plan / ED Course  I have reviewed the triage vital signs and the nursing notes.  Pertinent labs & imaging results that were available during my care of the patient were reviewed by me and considered in my medical decision making (see chart for details).     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  She does not appear septic or toxic.  No constitutional symptoms.  Vital signs in the ED are reassuring.  On exam, she does not have a surgical abdomen.  UA with positive nitrites, leukocyte esterase, and trace hemoglobinuria, pyuria.  Urine culture sent.  Pregnancy test is negative.  She does not  have a history of nephrolithiasis, but given hemoglobinuria, will order CT stone study.  CT stone study is negative for stone.  Unremarkable for other acute pathology.  Doubt ureterolithiasis, pyelonephritis, peritonitis, ovarian torsion, or appendicitis at this time.  Reviewed previous urine cultures and the patient has tolerated Keflex previously.  We will send her home with a course of similar and Pyridium for pain.  Recommended follow-up with her PCP for recheck in 3 to 4 days.  Strict return precautions given.  She is hemodynamically stable and in no acute distress.  She is  safe for discharge to home with outpatient follow-up at this time.  Final Clinical Impressions(s) / ED Diagnoses   Final diagnoses:  Acute cystitis with hematuria    ED Discharge Orders         Ordered    cephALEXin (KEFLEX) 500 MG capsule  2 times daily     01/03/18 2019    phenazopyridine (PYRIDIUM) 200 MG tablet  3 times daily with meals     01/03/18 2019    ondansetron (ZOFRAN ODT) 4 MG disintegrating tablet  Every 8 hours PRN     01/03/18 2025           McDonald, Mia A, PA-C 01/03/18 2238    Gwyneth Sprout, MD 01/03/18 2335

## 2018-01-03 NOTE — ED Notes (Signed)
ED Provider at bedside. 

## 2018-01-03 NOTE — ED Notes (Signed)
Pt given Rx x 3 at discharge. Family present to drive

## 2018-01-03 NOTE — ED Notes (Signed)
Patient transported to CT 

## 2018-01-03 NOTE — Discharge Instructions (Addendum)
Thank you for allowing me to care for you today in the Emergency Department.   Starting tomorrow morning, take 1 tablet of Keflex 2 times daily for the next week.  Her first dose was given in the emergency department.  Take your home medication for pain control.  You can also take Tylenol and ibuprofen every 6 hours.  Take 2 tablets of Pyridium with food 3 times daily to help with bladder pain and spasms related to your infection.   Please call and schedule follow-up appointment with Dr. Annabell Howells or with your primary care provider for recheck in the next 5 to 7 days.  Return to the emergency department if you develop significantly worsening pain pain, particularly after being on antibiotics for 2 to 3 days, high fevers that do not improve with taking Tylenol, worsening blood in your urine, or other new, concerning symptoms.

## 2018-01-03 NOTE — ED Triage Notes (Signed)
Right flank pain. States a couple of days ago she had blood in her urine.

## 2018-01-06 LAB — URINE CULTURE

## 2018-01-07 ENCOUNTER — Telehealth: Payer: Self-pay | Admitting: Emergency Medicine

## 2018-01-07 NOTE — Telephone Encounter (Signed)
Post ED Visit - Positive Culture Follow-up  Culture report reviewed by antimicrobial stewardship pharmacist:  []  Cathy Rowland, Pharm.D. []  Cathy Rowland, Pharm.D., BCPS AQ-ID []  Cathy Rowland, Pharm.D., BCPS []  Cathy Rowland, Pharm.D., BCPS []  Cathy Rowland, [address]Pharm.D., BCPS, AAHIVP []  Cathy Rowland, Pharm.D., BCPS, AAHIVP []  Cathy Rowland, PharmD, BCPS []  Cathy Rowland, PharmD, BCPS []  Cathy Rowland, PharmD, BCPS []  Cathy Rowland, PharmD [facility] CenterJosh Rowland PharmD  Positive urine culture Treated with cephalexin, organism sensitive to the same and no further patient follow-up is required at this time.  Cathy Rowland, Cathy Rowland 01/07/2018, 8:56 AM

## 2019-02-09 ENCOUNTER — Emergency Department (HOSPITAL_COMMUNITY): Payer: 59

## 2019-02-09 ENCOUNTER — Inpatient Hospital Stay (HOSPITAL_COMMUNITY)
Admission: EM | Admit: 2019-02-09 | Discharge: 2019-02-13 | DRG: 177 | Disposition: A | Discharge status: Home / Self Care | Payer: 59 | Attending: Internal Medicine | Admitting: Internal Medicine

## 2019-02-09 ENCOUNTER — Other Ambulatory Visit: Payer: Self-pay

## 2019-02-09 DIAGNOSIS — J1289 Other viral pneumonia: Secondary | ICD-10-CM | POA: Diagnosis present

## 2019-02-09 DIAGNOSIS — E669 Obesity, unspecified: Secondary | ICD-10-CM | POA: Diagnosis present

## 2019-02-09 DIAGNOSIS — Z888 Allergy status to other drugs, medicaments and biological substances status: Secondary | ICD-10-CM

## 2019-02-09 DIAGNOSIS — Z91040 Latex allergy status: Secondary | ICD-10-CM | POA: Diagnosis not present

## 2019-02-09 DIAGNOSIS — J189 Pneumonia, unspecified organism: Secondary | ICD-10-CM

## 2019-02-09 DIAGNOSIS — F419 Anxiety disorder, unspecified: Secondary | ICD-10-CM

## 2019-02-09 DIAGNOSIS — J9601 Acute respiratory failure with hypoxia: Secondary | ICD-10-CM | POA: Diagnosis present

## 2019-02-09 DIAGNOSIS — Z87440 Personal history of urinary (tract) infections: Secondary | ICD-10-CM

## 2019-02-09 DIAGNOSIS — I10 Essential (primary) hypertension: Secondary | ICD-10-CM | POA: Diagnosis present

## 2019-02-09 DIAGNOSIS — Z6841 Body Mass Index (BMI) 40.0 and over, adult: Secondary | ICD-10-CM | POA: Diagnosis not present

## 2019-02-09 DIAGNOSIS — U071 COVID-19: Secondary | ICD-10-CM | POA: Diagnosis present

## 2019-02-09 DIAGNOSIS — Z885 Allergy status to narcotic agent status: Secondary | ICD-10-CM

## 2019-02-09 DIAGNOSIS — M1990 Unspecified osteoarthritis, unspecified site: Secondary | ICD-10-CM | POA: Diagnosis present

## 2019-02-09 DIAGNOSIS — J1282 Pneumonia due to coronavirus disease 2019: Secondary | ICD-10-CM | POA: Diagnosis present

## 2019-02-09 DIAGNOSIS — Z20822 Contact with and (suspected) exposure to covid-19: Secondary | ICD-10-CM

## 2019-02-09 DIAGNOSIS — Z886 Allergy status to analgesic agent status: Secondary | ICD-10-CM

## 2019-02-09 DIAGNOSIS — F17220 Nicotine dependence, chewing tobacco, uncomplicated: Secondary | ICD-10-CM | POA: Diagnosis present

## 2019-02-09 DIAGNOSIS — Z79899 Other long term (current) drug therapy: Secondary | ICD-10-CM | POA: Diagnosis not present

## 2019-02-09 DIAGNOSIS — Z79891 Long term (current) use of opiate analgesic: Secondary | ICD-10-CM | POA: Diagnosis not present

## 2019-02-09 DIAGNOSIS — G8929 Other chronic pain: Secondary | ICD-10-CM | POA: Diagnosis present

## 2019-02-09 HISTORY — DX: Anxiety disorder, unspecified: F41.9

## 2019-02-09 HISTORY — DX: Essential (primary) hypertension: I10

## 2019-02-09 LAB — COMPREHENSIVE METABOLIC PANEL
ALT: 13 U/L (ref 0–44)
AST: 22 U/L (ref 15–41)
Albumin: 3.1 g/dL — ABNORMAL LOW (ref 3.5–5.0)
Alkaline Phosphatase: 45 U/L (ref 38–126)
Anion gap: 11 (ref 5–15)
BUN: 9 mg/dL (ref 6–20)
CO2: 25 mmol/L (ref 22–32)
Calcium: 8.8 mg/dL — ABNORMAL LOW (ref 8.9–10.3)
Chloride: 107 mmol/L (ref 98–111)
Creatinine, Ser: 0.93 mg/dL (ref 0.44–1.00)
GFR calc Af Amer: >60 mL/min (ref >60)
GFR calc non Af Amer: >60 mL/min (ref >60)
Glucose, Bld: 114 mg/dL — ABNORMAL HIGH (ref 70–99)
Potassium: 4.7 mmol/L (ref 3.5–5.1)
Sodium: 143 mmol/L (ref 135–145)
Total Bilirubin: 0.7 mg/dL (ref 0.3–1.2)
Total Protein: 6.8 g/dL (ref 6.5–8.1)

## 2019-02-09 LAB — LACTATE DEHYDROGENASE: LDH: 198 U/L — ABNORMAL HIGH (ref 98–192)

## 2019-02-09 LAB — PROCALCITONIN: Procalcitonin: <0.1 ng/mL

## 2019-02-09 LAB — HIV ANTIBODY (ROUTINE TESTING W REFLEX): HIV Screen 4th Generation wRfx: NONREACTIVE

## 2019-02-09 LAB — CBC WITH DIFFERENTIAL/PLATELET
Abs Immature Granulocytes: 0 10*3/uL (ref 0.00–0.07)
Basophils Absolute: 0 10*3/uL (ref 0.0–0.1)
Basophils Relative: 0 %
Eosinophils Absolute: 0 10*3/uL (ref 0.0–0.5)
Eosinophils Relative: 0 %
HCT: 38.4 % (ref 36.0–46.0)
Hemoglobin: 12.4 g/dL (ref 12.0–15.0)
Lymphocytes Relative: 16 %
Lymphs Abs: 0.9 10*3/uL (ref 0.7–4.0)
MCH: 29 pg (ref 26.0–34.0)
MCHC: 32.3 g/dL (ref 30.0–36.0)
MCV: 89.7 fL (ref 80.0–100.0)
Monocytes Absolute: 0.3 10*3/uL (ref 0.1–1.0)
Monocytes Relative: 5 %
Neutro Abs: 4.7 10*3/uL (ref 1.7–7.7)
Neutrophils Relative %: 79 %
Platelets: 216 10*3/uL (ref 150–400)
RBC: 4.28 MIL/uL (ref 3.87–5.11)
RDW: 12.5 % (ref 11.5–15.5)
WBC: 5.9 10*3/uL (ref 4.0–10.5)
nRBC: 0 % (ref 0.0–0.2)
nRBC: 0 /100 WBC

## 2019-02-09 LAB — I-STAT BETA HCG BLOOD, ED (MC, WL, AP ONLY): I-stat hCG, quantitative: <5 m[IU]/mL (ref <5)

## 2019-02-09 LAB — BRAIN NATRIURETIC PEPTIDE: B Natriuretic Peptide: 41.5 pg/mL (ref 0.0–100.0)

## 2019-02-09 LAB — LACTIC ACID, PLASMA
Lactic Acid, Venous: 1 mmol/L (ref 0.5–1.9)
Lactic Acid, Venous: 1 mmol/L (ref 0.5–1.9)

## 2019-02-09 LAB — TRIGLYCERIDES: Triglycerides: 90 mg/dL (ref <150)

## 2019-02-09 LAB — POC SARS CORONAVIRUS 2 AG -  ED: SARS Coronavirus 2 Ag: NEGATIVE

## 2019-02-09 LAB — FIBRINOGEN: Fibrinogen: 724 mg/dL — ABNORMAL HIGH (ref 210–475)

## 2019-02-09 LAB — C-REACTIVE PROTEIN: CRP: 24 mg/dL — ABNORMAL HIGH (ref <1.0)

## 2019-02-09 LAB — FERRITIN: Ferritin: 526 ng/mL — ABNORMAL HIGH (ref 11–307)

## 2019-02-09 LAB — D-DIMER, QUANTITATIVE: D-Dimer, Quant: 1.22 ug/mL-FEU — ABNORMAL HIGH (ref 0.00–0.50)

## 2019-02-09 MED ORDER — OXYCODONE-ACETAMINOPHEN 10-325 MG PO TABS: 1.0000 | ORAL_TABLET | Freq: Four times a day (QID) | ORAL | Status: DC | PRN

## 2019-02-09 MED ORDER — ZINC SULFATE 220 (50 ZN) MG PO CAPS
220.0000 mg | ORAL_CAPSULE | Freq: Every day | ORAL | Status: DC
  Administered 2019-02-10 – 2019-02-13 (×4): 220 mg via ORAL
  Filled 2019-02-09 (×4): qty 1

## 2019-02-09 MED ORDER — OXYCODONE-ACETAMINOPHEN 5-325 MG PO TABS
1.0000 | ORAL_TABLET | Freq: Four times a day (QID) | ORAL | Status: DC | PRN
  Administered 2019-02-10 – 2019-02-12 (×8): 1 via ORAL
  Filled 2019-02-09 (×8): qty 1

## 2019-02-09 MED ORDER — ALPRAZOLAM 0.5 MG PO TABS
1.0000 mg | ORAL_TABLET | Freq: Every evening | ORAL | Status: DC | PRN
  Administered 2019-02-10 – 2019-02-12 (×4): 1 mg via ORAL
  Filled 2019-02-09: qty 4
  Filled 2019-02-09 (×3): qty 2

## 2019-02-09 MED ORDER — GUAIFENESIN-DM 100-10 MG/5ML PO SYRP: 10.0000 mL | ORAL_SOLUTION | ORAL | Status: DC | PRN

## 2019-02-09 MED ORDER — VITAMIN D 25 MCG (1000 UNIT) PO TABS
2000.0000 [IU] | ORAL_TABLET | Freq: Every day | ORAL | Status: DC
  Administered 2019-02-10 – 2019-02-13 (×4): 2000 [IU] via ORAL
  Filled 2019-02-09 (×4): qty 2

## 2019-02-09 MED ORDER — ALBUTEROL SULFATE HFA 108 (90 BASE) MCG/ACT IN AERS
2.0000 | INHALATION_SPRAY | RESPIRATORY_TRACT | Status: DC | PRN
  Filled 2019-02-09: qty 6.7

## 2019-02-09 MED ORDER — DEXAMETHASONE SODIUM PHOSPHATE 10 MG/ML IJ SOLN
6.0000 mg | INTRAMUSCULAR | Status: DC
  Administered 2019-02-10: 6 mg via INTRAVENOUS
  Filled 2019-02-09: qty 1

## 2019-02-09 MED ORDER — SODIUM CHLORIDE 0.9 % IV SOLN
200.0000 mg | Freq: Once | INTRAVENOUS | Status: DC
  Filled 2019-02-09: qty 40

## 2019-02-09 MED ORDER — ASCORBIC ACID 500 MG PO TABS
500.0000 mg | ORAL_TABLET | Freq: Every day | ORAL | Status: DC
  Administered 2019-02-10 – 2019-02-13 (×4): 500 mg via ORAL
  Filled 2019-02-09 (×4): qty 1

## 2019-02-09 MED ORDER — ENOXAPARIN SODIUM 40 MG/0.4ML ~~LOC~~ SOLN
40.0000 mg | SUBCUTANEOUS | Status: DC
  Administered 2019-02-10 (×2): 40 mg via SUBCUTANEOUS
  Filled 2019-02-09 (×2): qty 0.4

## 2019-02-09 MED ORDER — OXYCODONE HCL 5 MG PO TABS
5.0000 mg | ORAL_TABLET | Freq: Four times a day (QID) | ORAL | Status: DC | PRN
  Administered 2019-02-10 – 2019-02-13 (×3): 5 mg via ORAL
  Filled 2019-02-09 (×5): qty 1

## 2019-02-09 MED ORDER — ACETAMINOPHEN 325 MG PO TABS: 650.0000 mg | ORAL_TABLET | Freq: Four times a day (QID) | ORAL | Status: DC | PRN

## 2019-02-09 MED ORDER — SODIUM CHLORIDE 0.9 % IV SOLN: 100.0000 mg | Freq: Every day | INTRAVENOUS | Status: DC

## 2019-02-09 NOTE — H&P (Signed)
History and Physical    Cathy Rowland JIR:[id number] DOB: 11/30/1970 DOA: 02/09/2019  PCP: Aletha Halim., PA-C Patient coming from: Nursing home  Chief Complaint: Hypoxia  HPI: Cathy Rowland is a 48 y.o. female with medical history significant of recurrent UTIs, pyelonephritis presenting to the ED from her nursing home for evaluation of hypoxia.  Patient underwent routine Covid testing at her nursing home and tested positive last week.  Only symptom at that time was fevers.  Noted to be hypoxic with oxygen saturation as low as 79% on room air and was sent to the ED.  She was placed on 4 L supplemental oxygen and oxygen saturation improved to the low 90s.  Patient states she had a Covid test done at her nursing home on 12/7 and it came back positive.  Since then she has been having fevers and a slight cough but no other symptoms.  Denies shortness of breath, loss of sense of taste/smell, nausea, vomiting, or diarrhea.  States she was started on treatment for a UTI a week ago and is currently not having any symptoms.  ED Course: Rapid SARS-CoV-2 antigen test negative, PCR test pending.  Afebrile and no leukocytosis.  Lactic acid x2 normal.  Inflammatory markers elevated: D-dimer 1.22, LDH 198, fibrinogen 724, ferritin 526, and CRP 24.  Procalcitonin level pending.  UA and urine culture pending.  Blood culture x2 pending.  Chest x-ray showing hazy bilateral airspace disease suspicious for edema versus pneumonia.  Review of Systems:  All systems reviewed and apart from history of presenting illness, are negative.  Past Medical History:  Diagnosis Date  . Arthritis   . Bladder infection   . Bulging discs   . Chronic UTI   . Female bladder prolapse   . Menorrhagia   . Pyelonephritis   . Uterine polyp   . Yeast infection of the vagina     Past Surgical History:  Procedure Laterality Date  . BLADDER SURGERY    . CARPAL TUNNEL RELEASE    . CERVICAL POLYPECTOMY    . CHOLECYSTECTOMY     . GYNECOLOGIC CRYOSURGERY    . WRIST SURGERY     Carpal Tunnel surgery     reports that she has never smoked. Her smokeless tobacco use includes snuff. She reports that she does not drink alcohol or use drugs.  Allergies  Allergen Reactions  . Aspirin Other (See Comments)    Upsets ulcer  . Cyclobenzaprine Other (See Comments)    Unknown  . Hydrocodone Other (See Comments)    Fidgety and jittery  . Mesalamine Other (See Comments)    Upsets ulcer  . Septra [Sulfamethoxazole-Trimethoprim] Nausea And Vomiting  . Latex Rash    Unknown    Family History  Problem Relation Age of Onset  . Diabetes Father   . Hypertension Father   . Heart attack Neg Hx   . Hyperlipidemia Neg Hx     Prior to Admission medications   Medication Sig Start Date End Date Taking? Authorizing Provider  ALPRAZolam Duanne Moron) 1 MG tablet Take 1 mg by mouth at bedtime as needed for anxiety or sleep.   Yes [provider]  ciprofloxacin (CIPRO) 500 MG tablet Take 500 mg by mouth 2 (two) times daily.   Yes [provider]  nitrofurantoin (MACRODANTIN) 50 MG capsule Take 50 mg by mouth daily.   Yes [provider]  oxyCODONE-acetaminophen (PERCOCET) 10-325 MG per tablet Take 1 tablet by mouth every 6 (six) hours as  needed for pain.   Yes [provider]  phenazopyridine (PYRIDIUM) 200 MG tablet Take 1 tablet (200 mg total) by mouth 3 (three) times daily with meals. 01/03/18  Yes McDonald, Mia A, PA-C  PROAIR HFA 108 (90 Base) MCG/ACT inhaler Inhale 2 puffs into the lungs every 4 (four) hours as needed for wheezing or shortness of breath.  03/14/15  Yes [provider]    Physical Exam: Vitals:   02/09/19 1930 02/09/19 2000 02/09/19 2030 02/09/19 2115  BP: 119/71 118/63 (!) 117/59   Pulse: 82 85 89   Resp: (!) 31 (!) 21 (!) 28   Temp:      TempSrc:      SpO2: 93% 93% 95% 97%    Physical Exam  Constitutional: She is oriented to person, place, and time. She appears  well-developed and well-nourished. No distress.  HENT:  Head: Normocephalic.  Eyes: Right eye exhibits no discharge. Left eye exhibits no discharge.  Cardiovascular: Normal rate, regular rhythm and intact distal pulses.  Pulmonary/Chest: Effort normal and breath sounds normal. She has no wheezes.  Oxygen saturation in the mid 90s on 5 L supplemental oxygen via nasal cannula Slightly tachypneic with respiratory rate in the 20s  Abdominal: Soft. Bowel sounds are normal. She exhibits no distension. There is no abdominal tenderness. There is no guarding.  Musculoskeletal:        General: No edema.     Cervical back: Neck supple.  Neurological: She is alert and oriented to person, place, and time.  Skin: Skin is warm and dry. She is not diaphoretic.   Labs on Admission: I have personally reviewed following labs and imaging studies  CBC: Recent Labs  Lab 02/09/19 1751  WBC 5.9  NEUTROABS 4.7  HGB 12.4  HCT 38.4  MCV 89.7  PLT 216   Basic Metabolic Panel: Recent Labs  Lab 02/09/19 1751  NA 143  K 4.7  CL 107  CO2 25  GLUCOSE 114*  BUN 9  CREATININE 0.93  CALCIUM 8.8*   GFR: CrCl cannot be calculated (Unknown ideal weight.). Liver Function Tests: Recent Labs  Lab 02/09/19 1751  AST 22  ALT 13  ALKPHOS 45  BILITOT 0.7  PROT 6.8  ALBUMIN 3.1*   No results for input(s): LIPASE, AMYLASE in the last 168 hours. No results for input(s): AMMONIA in the last 168 hours. Coagulation Profile: No results for input(s): INR, PROTIME in the last 168 hours. Cardiac Enzymes: No results for input(s): CKTOTAL, CKMB, CKMBINDEX, TROPONINI in the last 168 hours. BNP (last 3 results) No results for input(s): PROBNP in the last 8760 hours. HbA1C: No results for input(s): HGBA1C in the last 72 hours. CBG: No results for input(s): GLUCAP in the last 168 hours. Lipid Profile: Recent Labs    02/09/19 1751  TRIG 90   Thyroid Function Tests: No results for input(s): TSH, T4TOTAL,  FREET4, T3FREE, THYROIDAB in the last 72 hours. Anemia Panel: Recent Labs    02/09/19 1830  FERRITIN 526*   Urine analysis:    Component Value Date/Time   COLORURINE YELLOW 01/03/2018 1800   APPEARANCEUR TURBID (A) 01/03/2018 1800   LABSPEC 1.015 01/03/2018 1800   PHURINE 6.5 01/03/2018 1800   GLUCOSEU NEGATIVE 01/03/2018 1800   HGBUR TRACE (A) 01/03/2018 1800   HGBUR negative 04/02/2010 1700   BILIRUBINUR NEGATIVE 01/03/2018 1800   KETONESUR NEGATIVE 01/03/2018 1800   PROTEINUR NEGATIVE 01/03/2018 1800   UROBILINOGEN 1.0 09/27/2014 1440   NITRITE POSITIVE (A) 01/03/2018  1800   LEUKOCYTESUR MODERATE (A) 01/03/2018 1800    Radiological Exams on Admission: DG Chest Port 1 View  Result Date: 02/09/2019 CLINICAL DATA:  Right flank pain for approximately 2 days. Hematuria. EXAM: PORTABLE CHEST 1 VIEW COMPARISON:  PA and lateral chest 04/16/2013. FINDINGS: There is hazy bilateral airspace disease. Heart size is upper normal. No pneumothorax or pleural fusion. IMPRESSION: Hazy bilateral airspace disease could be due to edema or pneumonia. Electronically Signed   By: Drusilla Kannerhomas  Dalessio M.D.   On: 02/09/2019 18:42    EKG: Independently reviewed.  Sinus rhythm, low voltage.  Assessment/Plan Principal Problem:   Acute respiratory failure with hypoxia (HCC) Active Problems:   Suspected COVID-19 virus infection   Pneumonia   Anxiety   History of recurrent UTI (urinary tract infection)   Acute hypoxic respiratory failure secondary to suspected COVID-19 viral pneumonia Oxygen saturation 79% on room air, currently satting well on 5 L supplemental oxygen via nasal cannula. Per patient, COVID-19 test done on 12/7 at her nursing home was positive (no documentation available).  Rapid SARS-CoV-2 antigen test done in the ED negative, PCR test pending. Chest x-ray showing hazy bilateral airspace disease suspicious for edema versus pneumonia.  Edema less likely as patient does not appear volume  overloaded on exam.  Suspicion for COVID-19 viral pneumonia given elevated inflammatory markers:  D-dimer 1.22, LDH 198, fibrinogen 724, ferritin 526, and CRP 24.  Bacterial pneumonia less likely given no fever or leukocytosis. -IV Decadron 6 mg daily -Order remdesivir after Covid 19 infection is confirmed on repeat testing -Vitamin C, zinc, vitamin D3 -Antitussive as needed -Tylenol as needed -Inhaler as needed -Daily CBC with differential, CMP, CRP, D-dimer, LDH -Continue Airborne and contact precautions; Covid PCR test pending -Continuous pulse ox -Supplemental oxygen as needed to keep oxygen saturation above 90% -Blood culture x2 pending -Procalcitonin level pending -Self proning for 2 to 3 hours every 12 hours  History of recurrent UTIs She was started on antibiotics last week for a UTI.  Currently not endorsing any UTI symptoms.  Afebrile no leukocytosis. -UA and urine culture pending  Anxiety -Continue home Xanax as needed  Chronic pain -Continue home Percocet as needed  DVT prophylaxis: Lovenox Code Status: Full code.  Discussed with the patient. Family Communication: No family available. Disposition Plan: Anticipate discharge after clinical improvement. Consults called: None Admission status: It is my clinical opinion that admission to INPATIENT is reasonable and necessary in this 48 y.o. female . presenting with acute hypoxic respiratory failure secondary to suspected COVID-19 viral pneumonia.  Currently requiring 5 L supplemental oxygen via nasal cannula.  High risk of decompensation.  Given the aforementioned, the predictability of an adverse outcome is felt to be significant. I expect that the patient will require at least 2 midnights in the hospital to treat this condition.   The medical decision making on this patient was of high complexity and the patient is at high risk for clinical deterioration, therefore this is a level 3 visit.  John GiovanniVasundhra Dorance Spink MD [facility] Pager [phone]  If 7PM-7AM, please contact night-coverage [email or web address] Password [email or web address]  02/09/2019, 9:47 PM

## 2019-02-09 NOTE — ED Triage Notes (Signed)
Pt works at a nursing home, was swabbed routinely and tested positive per Wed. Only fever at that time was fever. Pt denies SOB, but noted low spO2 at home (79%), was told by MD to come to ED despite not feeling any sx. In ED lobby, spO2 in 80s, returned to 93%on 4L [place].

## 2019-02-09 NOTE — ED Provider Notes (Signed)
[facility] EMERGENCY DEPARTMENT Provider Note   CSN: [id number] Arrival date & time: 02/09/19  1718     History Chief Complaint  Patient presents with  . Shortness of Breath    Cathy Rowland is a 48 y.o. female.  Patient with history of recurrent UTIs presents to the emergency department with hypoxia in the setting of a positive Covid test.  She was seen with fever on 02/02/2019.  At this time the fever was attributed to recurrent pyelonephritis.  Patient was started on ciprofloxacin twice daily x10 days which was later changed to Macrobid.  Patient presented to her primary care physician today after having a positive coronavirus test at her job.  Patient works in a nursing facility.  She has been having persistently low oxygen saturations in the 80s.  Given the hypoxia she presents the emergency department today.  Patient denies any significant chest pain or shortness of breath.  She denies any nausea, vomiting, or abdominal pain.  She can still taste and smell.  No lower extremity swelling or unilateral leg pain.  She does not have a history of asthma, COPD, congestive heart failure.  She does not smoke cigarettes.  Onset of symptoms acute.  Course is constant.        Past Medical History:  Diagnosis Date  . Arthritis   . Bladder infection   . Bulging discs   . Chronic UTI   . Female bladder prolapse   . Menorrhagia   . Pyelonephritis   . Uterine polyp   . Yeast infection of the vagina     Patient Active Problem List   Diagnosis Date Noted  . Pain in limb 06/10/2013  . Swelling of limb 06/10/2013  . Right shoulder pain 07/19/2011  . Carpal tunnel syndrome, right 07/19/2011  . ACUTE BRONCHITIS 04/02/2010  . FLANK PAIN, RIGHT 04/02/2010    Past Surgical History:  Procedure Laterality Date  . BLADDER SURGERY    . CARPAL TUNNEL RELEASE    . CERVICAL POLYPECTOMY    . CHOLECYSTECTOMY    . GYNECOLOGIC CRYOSURGERY    . WRIST SURGERY     Carpal  Tunnel surgery     OB History    Gravida  3   Para  3   Term  2   Preterm  1   AB      Living  3     SAB      TAB      Ectopic      Multiple      Live Births              Family History  Problem Relation Age of Onset  . Diabetes Father   . Hypertension Father   . Heart attack Neg Hx   . Hyperlipidemia Neg Hx     Social History   Tobacco Use  . Smoking status: Never Smoker  . Smokeless tobacco: Current User    Types: Snuff  . Tobacco comment: 1 box every 2-3 days.  Substance Use Topics  . Alcohol use: No  . Drug use: No    Home Medications Prior to Admission medications   Medication Sig Start Date End Date Taking? Authorizing Provider  ALPRAZolam Prudy Feeler) 1 MG tablet Take 1 mg by mouth at bedtime as needed for anxiety or sleep.    [provider]  cephALEXin (KEFLEX) 500 MG capsule Take 1 capsule (500 mg total) by mouth 2 (two) times daily. 01/03/18  McDonald, Mia A, PA-C  nitrofurantoin (MACRODANTIN) 50 MG capsule Take 50 mg by mouth daily.    [provider]  ondansetron (ZOFRAN ODT) 4 MG disintegrating tablet Take 1 tablet (4 mg total) by mouth every 8 (eight) hours as needed for nausea or vomiting. 01/03/18   McDonald, Mia A, PA-C  oxyCODONE-acetaminophen (PERCOCET) 10-325 MG per tablet Take 1 tablet by mouth every 6 (six) hours as needed for pain.    [provider]  phenazopyridine (PYRIDIUM) 200 MG tablet Take 1 tablet (200 mg total) by mouth 3 (three) times daily with meals. 01/03/18   McDonald, Mia A, PA-C  PROAIR HFA 108 (90 Base) MCG/ACT inhaler Inhale 2 puffs into the lungs every 4 (four) hours as needed for wheezing or shortness of breath.  03/14/15   [provider]    Allergies    Aspirin, Cyclobenzaprine, Hydrocodone, Mesalamine, Septra [sulfamethoxazole-trimethoprim], and Latex  Review of Systems   Review of Systems  Constitutional: Positive for chills and fever.  HENT: Negative for rhinorrhea and  sore throat.   Eyes: Negative for redness.  Respiratory: Positive for cough (Minimal, occasional). Negative for shortness of breath.   Cardiovascular: Negative for chest pain.  Gastrointestinal: Negative for abdominal pain, diarrhea, nausea and vomiting.  Genitourinary: Negative for dysuria.  Musculoskeletal: Negative for myalgias.  Skin: Negative for rash.  Neurological: Negative for headaches.    Physical Exam Updated Vital Signs BP 126/71 (BP Location: Right Arm)   Pulse 97   Temp 97.9 F (36.6 C) (Oral)   Resp 18   SpO2 (!) 81%   Physical Exam Vitals and nursing note reviewed.  Constitutional:      Appearance: She is well-developed. She is obese.  HENT:     Head: Normocephalic and atraumatic.  Eyes:     General:        Right eye: No discharge.        Left eye: No discharge.     Conjunctiva/sclera: Conjunctivae normal.  Cardiovascular:     Rate and Rhythm: Normal rate and regular rhythm.     Heart sounds: Normal heart sounds.  Pulmonary:     Effort: Pulmonary effort is normal.     Breath sounds: Normal breath sounds.  Abdominal:     Palpations: Abdomen is soft.     Tenderness: There is no abdominal tenderness. There is no guarding or rebound.  Musculoskeletal:        General: No swelling.     Cervical back: Normal range of motion and neck supple.  Skin:    General: Skin is warm and dry.  Neurological:     Mental Status: She is alert.     ED Results / Procedures / Treatments   Labs (all labs ordered are listed, but only abnormal results are displayed) Labs Reviewed  COMPREHENSIVE METABOLIC PANEL - Abnormal; Notable for the following components:      Result Value   Glucose, Bld 114 (*)    Calcium 8.8 (*)    Albumin 3.1 (*)    All other components within normal limits  D-DIMER, QUANTITATIVE (NOT AT [facility]) - Abnormal; Notable for the following components:   D-Dimer, Quant 1.22 (*)    All other components within normal limits  LACTATE DEHYDROGENASE -  Abnormal; Notable for the following components:   LDH 198 (*)    All other components within normal limits  FIBRINOGEN - Abnormal; Notable for the following components:   Fibrinogen 724 (*)    All other components  within normal limits  C-REACTIVE PROTEIN - Abnormal; Notable for the following components:   CRP 24.0 (*)    All other components within normal limits  FERRITIN - Abnormal; Notable for the following components:   Ferritin 526 (*)    All other components within normal limits  CULTURE, BLOOD (ROUTINE X 2)  CULTURE, BLOOD (ROUTINE X 2)  URINE CULTURE  SARS CORONAVIRUS 2 (TAT 6-24 HRS)  LACTIC ACID, PLASMA  LACTIC ACID, PLASMA  CBC WITH DIFFERENTIAL/PLATELET  TRIGLYCERIDES  PROCALCITONIN  URINALYSIS, ROUTINE W REFLEX MICROSCOPIC  I-STAT BETA HCG BLOOD, ED ([facility], [facility], [facility] ONLY)  POC SARS CORONAVIRUS 2 AG -  ED    ED ECG REPORT   Date: 02/09/2019  Rate: 91  Rhythm: normal sinus rhythm  QRS Axis: normal  Intervals: normal  ST/T Wave abnormalities: nonspecific T wave changes  Conduction Disutrbances:none  Narrative Interpretation:   Old EKG Reviewed: unchanged except t-waves flatter today from 2015  I have personally reviewed the EKG tracing and agree with the computerized printout as noted.  Radiology DG Chest Port 1 View  Result Date: 02/09/2019 CLINICAL DATA:  Right flank pain for approximately 2 days. Hematuria. EXAM: PORTABLE CHEST 1 VIEW COMPARISON:  PA and lateral chest 04/16/2013. FINDINGS: There is hazy bilateral airspace disease. Heart size is upper normal. No pneumothorax or pleural fusion. IMPRESSION: Hazy bilateral airspace disease could be due to edema or pneumonia. Electronically Signed   By: Drusilla Kanner M.D.   On: 02/09/2019 18:42    Procedures Procedures (including critical care time)  Medications Ordered in ED Medications - No data to display  ED Course  I have reviewed the triage vital signs and the nursing notes.  Pertinent labs & imaging  results that were available during my care of the patient were reviewed by me and considered in my medical decision making (see chart for details).  Patient seen and examined.  Reviewed patient records and care everywhere.  Upon entering room, patient is in no distress.  Upon being placed on the monitor her O2 saturation is 79-81% with good waveform.  I placed the patient on oxygen by nasal cannula and eventually at 4 L she was saturating at 92%.  She is tolerating her hypoxia very well.  Discussed with patient that she will likely need admission to the hospital.  Lab work-up, chest x-ray ordered.  Will recheck UA as well.  Vital signs reviewed and are as follows: BP 126/71 (BP Location: Right Arm)   Pulse 97   Temp 97.9 F (36.6 C) (Oral)   Resp 18   SpO2 96%   8:40 PM Discussed case with Dr. Loney Loh who will see. Rapid covid neg -- reflex test ordered.   BP 119/71   Pulse 82   Temp 97.9 F (36.6 C) (Oral)   Resp (!) 31   SpO2 93%   CRITICAL CARE Performed by: Renne Crigler  PA-C Total critical care time: 30 minutes Critical care time was exclusive of separately billable procedures and treating other patients. Critical care was necessary to treat or prevent imminent or life-threatening deterioration. Critical care was time spent personally by me on the following activities: development of treatment plan with patient and/or surrogate as well as nursing, discussions with consultants, evaluation of patient's response to treatment, examination of patient, obtaining history from patient or surrogate, ordering and performing treatments and interventions, ordering and review of laboratory studies, ordering and review of radiographic studies, pulse oximetry and re-evaluation of patient's condition.  MDM Rules/Calculators/A&P                      Admit for resp failure, suspected COVID.   Final Clinical Impression(s) / ED Diagnoses Final diagnoses:  Acute respiratory failure with  hypoxia ([place])  Suspected COVID-19 virus infection    Rx / DC Orders ED Discharge Orders    None       Carlisle Cater, PA-C 02/09/19 2042    Gareth Morgan, MD 02/12/19 [phone]

## 2019-02-10 ENCOUNTER — Encounter (HOSPITAL_COMMUNITY): Payer: Self-pay | Admitting: Internal Medicine

## 2019-02-10 DIAGNOSIS — I10 Essential (primary) hypertension: Secondary | ICD-10-CM

## 2019-02-10 DIAGNOSIS — J1289 Other viral pneumonia: Secondary | ICD-10-CM

## 2019-02-10 DIAGNOSIS — U071 COVID-19: Principal | ICD-10-CM

## 2019-02-10 DIAGNOSIS — F419 Anxiety disorder, unspecified: Secondary | ICD-10-CM

## 2019-02-10 DIAGNOSIS — J1282 Pneumonia due to coronavirus disease 2019: Secondary | ICD-10-CM | POA: Diagnosis present

## 2019-02-10 LAB — CBC WITH DIFFERENTIAL/PLATELET
Abs Immature Granulocytes: 0.04 10*3/uL (ref 0.00–0.07)
Basophils Absolute: 0 10*3/uL (ref 0.0–0.1)
Basophils Relative: 0 %
Eosinophils Absolute: 0 10*3/uL (ref 0.0–0.5)
Eosinophils Relative: 0 %
HCT: 40.6 % (ref 36.0–46.0)
Hemoglobin: 12.9 g/dL (ref 12.0–15.0)
Immature Granulocytes: 1 %
Lymphocytes Relative: 17 %
Lymphs Abs: 0.9 10*3/uL (ref 0.7–4.0)
MCH: 28.4 pg (ref 26.0–34.0)
MCHC: 31.8 g/dL (ref 30.0–36.0)
MCV: 89.4 fL (ref 80.0–100.0)
Monocytes Absolute: 0.3 10*3/uL (ref 0.1–1.0)
Monocytes Relative: 5 %
Neutro Abs: 4.2 10*3/uL (ref 1.7–7.7)
Neutrophils Relative %: 77 %
Platelets: 232 10*3/uL (ref 150–400)
RBC: 4.54 MIL/uL (ref 3.87–5.11)
RDW: 12.5 % (ref 11.5–15.5)
WBC: 5.5 10*3/uL (ref 4.0–10.5)
nRBC: 0 % (ref 0.0–0.2)

## 2019-02-10 LAB — COMPREHENSIVE METABOLIC PANEL
ALT: 13 U/L (ref 0–44)
AST: 23 U/L (ref 15–41)
Albumin: 3.1 g/dL — ABNORMAL LOW (ref 3.5–5.0)
Alkaline Phosphatase: 47 U/L (ref 38–126)
Anion gap: 9 (ref 5–15)
BUN: 9 mg/dL (ref 6–20)
CO2: 27 mmol/L (ref 22–32)
Calcium: 9.1 mg/dL (ref 8.9–10.3)
Chloride: 106 mmol/L (ref 98–111)
Creatinine, Ser: 0.86 mg/dL (ref 0.44–1.00)
GFR calc Af Amer: >60 mL/min (ref >60)
GFR calc non Af Amer: >60 mL/min (ref >60)
Glucose, Bld: 170 mg/dL — ABNORMAL HIGH (ref 70–99)
Potassium: 4.5 mmol/L (ref 3.5–5.1)
Sodium: 142 mmol/L (ref 135–145)
Total Bilirubin: 0.3 mg/dL (ref 0.3–1.2)
Total Protein: 7.2 g/dL (ref 6.5–8.1)

## 2019-02-10 LAB — GLUCOSE, CAPILLARY
Glucose-Capillary: 179 mg/dL — ABNORMAL HIGH (ref 70–99)
Glucose-Capillary: 265 mg/dL — ABNORMAL HIGH (ref 70–99)

## 2019-02-10 LAB — LACTATE DEHYDROGENASE: LDH: 225 U/L — ABNORMAL HIGH (ref 98–192)

## 2019-02-10 LAB — D-DIMER, QUANTITATIVE: D-Dimer, Quant: 1.32 ug/mL-FEU — ABNORMAL HIGH (ref 0.00–0.50)

## 2019-02-10 LAB — ABO/RH: ABO/RH(D): O POS

## 2019-02-10 LAB — SARS CORONAVIRUS 2 (TAT 6-24 HRS): SARS Coronavirus 2: POSITIVE — AB

## 2019-02-10 LAB — C-REACTIVE PROTEIN: CRP: 24.6 mg/dL — ABNORMAL HIGH (ref <1.0)

## 2019-02-10 MED ORDER — INSULIN ASPART 100 UNIT/ML ~~LOC~~ SOLN
0.0000 [IU] | Freq: Three times a day (TID) | SUBCUTANEOUS | Status: DC
  Administered 2019-02-10: 3 [IU] via SUBCUTANEOUS
  Administered 2019-02-10: 2 [IU] via SUBCUTANEOUS
  Administered 2019-02-11: 5 [IU] via SUBCUTANEOUS
  Administered 2019-02-11: 2 [IU] via SUBCUTANEOUS

## 2019-02-10 MED ORDER — METHYLPREDNISOLONE SODIUM SUCC 125 MG IJ SOLR
60.0000 mg | Freq: Two times a day (BID) | INTRAMUSCULAR | Status: DC
  Administered 2019-02-10 – 2019-02-12 (×5): 60 mg via INTRAVENOUS
  Filled 2019-02-10 (×5): qty 2

## 2019-02-10 MED ORDER — SODIUM CHLORIDE 0.9 % IV SOLN
200.0000 mg | Freq: Once | INTRAVENOUS | Status: AC
  Administered 2019-02-10: 200 mg via INTRAVENOUS

## 2019-02-10 MED ORDER — TOCILIZUMAB 400 MG/20ML IV SOLN
800.0000 mg | Freq: Once | INTRAVENOUS | Status: AC
  Administered 2019-02-10: 800 mg via INTRAVENOUS
  Filled 2019-02-10: qty 40

## 2019-02-10 MED ORDER — SODIUM CHLORIDE 0.9 % IV SOLN
100.0000 mg | Freq: Every day | INTRAVENOUS | Status: AC
  Administered 2019-02-10 – 2019-02-13 (×4): 100 mg via INTRAVENOUS
  Filled 2019-02-10 (×5): qty 20

## 2019-02-10 NOTE — ED Notes (Signed)
Ordered breakfast--Elma Shands 

## 2019-02-10 NOTE — ED Notes (Signed)
[facility] notified by Network engineer for transport.

## 2019-02-10 NOTE — Progress Notes (Signed)
PROGRESS NOTE                                                                                                                                                                                                             Patient Demographics:    Cathy Rowland, is a 48 y.o. female, DOB - 02-Nov-1970, WUJ:[id number]  Outpatient Primary MD for the patient is Richmond Campbell., PA-C   Admit date - 02/09/2019   LOS - 1  Chief Complaint  Patient presents with  . Covid/SOB       Brief Narrative: Patient is a 48 y.o. 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   Subjective:    Cathy Rowland today still remains on 6 L of oxygen-she gets short of breath with minimal movement.  She feels essentially the same as she did on admission.   Assessment  & Plan :   Acute Hypoxic Resp Failure due to Covid 19 Viral pneumonia: Remains tenuous-but stable on 6 L-CRP remains significantly elevated and essentially unchanged compared to yesterday.  We will switch from Decadron to Solu-Medrol, continue with remdesivir.  Given significant elevated inflammatory markers-persistent hypoxemia requiring 6 L of oxygen to maintain O2 saturations-this MD had a discussion with her regarding off label use of Actemra.  Rationale, risks, benefits was discussed in detail.  The rationale for the off label use of Actemra its known side effects, potential benefits was  discussed with patient .The use of Actemra is based on published clinical articles/anecdotal data as several randomized trials have been negative, but lately there have been a few positive randomized trials as well.  There is no history of tuberculosis, hepatitis B or C.  Complete risks and long-term side effects are unknown, however  in the best clinical judgment it is felt that the clinical benefit at this time outweighs medical risks given tenuous clinical state of the patient.  Patient agrees with the treatment plan and consent to the use of Actemra.    Fever: afebrile  O2 requirements:  SpO2: 92 % O2 Flow Rate (L/min): 6 L/min   COVID-19 Labs: Recent Labs    02/09/19 1751 02/09/19 1830 02/10/19  0349  DDIMER 1.22*  --  1.32*  FERRITIN  --  526*  --   LDH 198*  --  225*  CRP  --  24.0* 24.6*       Component Value Date/Time   BNP 41.5 02/09/2019 1751    Recent Labs  Lab 02/09/19 1751  PROCALCITON <0.10    Lab Results  Component Value Date   SARSCOV2NAA POSITIVE (A) 02/09/2019     COVID-19 Medications: Steroids: 12/15>>  Remdesivir: 12/15>> Actemra: 12/16>> x1  Other medications: Diuretics:Euvolemic-Lasix 40 mg x 1 to maintain negative balance. Antibiotics:Not needed as no evidence of bacterial infection Insulin: Monitor CBGs on SSI while on steroids.  A1c pending.  Prone/Incentive Spirometry: encouraged patient to lie prone for 3-4 hours at a time for a total of 16 hours a day, and to encourage incentive spirometry use 3-4/hour  DVT Prophylaxis  :  Lovenox  Hypertension: Blood pressure currently stable-hold amlodipine for now.  History of recurrent UTIs: Apparently was treated less than a week back-currently she has no symptoms of UTI.  Anxiety: Continue Xanax as needed  Chronic pain: Continue as needed Percocet  Obesity: Estimated body mass index is 43.94 kg/m as calculated from the following:   Height as of this encounter: 5\' 4"  (1.626 m).   Weight as of this encounter: 116.1 kg.   Consults  :  None  Procedures  :  None  ABG:    Component Value Date/Time   TCO2 26 03/06/2010 1031    Vent Settings: N/A  Condition - Extremely Guarded  Family Communication  :  Spouse updated over the phone  Code Status :  Full Code  Diet :  Diet Order            Diet Heart Room  service appropriate? Yes; Fluid consistency: Thin  Diet effective now               Disposition Plan  :  Remain hospitalized  Barriers to discharge: Hypoxia requiring O2 supplementation/complete 5 days of IV Remdesivir  Antimicorbials  :    Anti-infectives (From admission, onward)   Start     Dose/Rate Route Frequency Ordered Stop   02/10/19 1000  remdesivir 100 mg in sodium chloride 0.9 % 100 mL IVPB  Status:  Discontinued     100 mg 200 mL/hr over 30 Minutes Intravenous Daily 02/09/19 2109 02/09/19 2139   02/10/19 1000  remdesivir 100 mg in sodium chloride 0.9 % 100 mL IVPB     100 mg 200 mL/hr over 30 Minutes Intravenous Daily 02/10/19 0421 02/14/19 0959   02/10/19 0500  remdesivir 200 mg in sodium chloride 0.9% 250 mL IVPB     200 mg 580 mL/hr over 30 Minutes Intravenous Once 02/10/19 0421 02/10/19 0506   02/09/19 2115  remdesivir 200 mg in sodium chloride 0.9% 250 mL IVPB  Status:  Discontinued     200 mg 580 mL/hr over 30 Minutes Intravenous Once 02/09/19 2109 02/09/19 2139      Inpatient Medications  Scheduled Meds: . vitamin C  500 mg Oral Daily  . cholecalciferol  2,000 Units Oral Daily  . enoxaparin (LOVENOX) injection  40 mg Subcutaneous Q24H  . insulin aspart  0-9 Units Subcutaneous TID WC  . methylPREDNISolone (SOLU-MEDROL) injection  60 mg Intravenous Q12H  . zinc sulfate  220 mg Oral Daily   Continuous Infusions: . remdesivir 100 mg in NS 100 mL    . tocilizumab (ACTEMRA) IV     PRN  Meds:.acetaminophen, albuterol, ALPRAZolam, guaiFENesin-dextromethorphan, oxyCODONE-acetaminophen **AND** oxyCODONE   Time Spent in minutes  35   See all Orders from today for further details   Jeoffrey MassedShanker Luiza Carranco M.D on 02/10/2019 at 12:05 PM  To page go to [email or web address] - use universal password  [facility] -  Office  [phone]    Objective:   Vitals:   02/10/19 0900 02/10/19 0930 02/10/19 1138 02/10/19 1149  BP: 107/70 (!) 113/55  130/64  Pulse: 73 95   88  Resp: 14 (!) 28  (!) 24  Temp:      TempSrc:      SpO2: 95% 96%  92%  Weight:   116.1 kg 116.1 kg  Height:   5\' 4"  (1.626 m) 5\' 4"  (1.626 m)    Wt Readings from Last 3 Encounters:  02/10/19 116.1 kg  01/03/18 121.1 kg  02/06/17 119.7 kg    No intake or output data in the 24 hours ending 02/10/19 1205   Physical Exam Gen Exam:Alert awake-not in any distress HEENT:atraumatic, normocephalic Chest: B/L clear to auscultation anteriorly-has few fine crackles in bilateral lung bases. CVS:S1S2 regular Abdomen:soft non tender, non distended Extremities:no edema Neurology: Non focal Skin: no rash   Data Review:    CBC Recent Labs  Lab 02/09/19 1751 02/10/19 0349  WBC 5.9 5.5  HGB 12.4 12.9  HCT 38.4 40.6  PLT 216 232  MCV 89.7 89.4  MCH 29.0 28.4  MCHC 32.3 31.8  RDW 12.5 12.5  LYMPHSABS 0.9 0.9  MONOABS 0.3 0.3  EOSABS 0.0 0.0  BASOSABS 0.0 0.0    Chemistries  Recent Labs  Lab 02/09/19 1751 02/10/19 0349  NA 143 142  K 4.7 4.5  CL 107 106  CO2 25 27  GLUCOSE 114* 170*  BUN 9 9  CREATININE 0.93 0.86  CALCIUM 8.8* 9.1  AST 22 23  ALT 13 13  ALKPHOS 45 47  BILITOT 0.7 0.3   ------------------------------------------------------------------------------------------------------------------ Recent Labs    02/09/19 1751  TRIG 90    No results found for: HGBA1C ------------------------------------------------------------------------------------------------------------------ No results for input(s): TSH, T4TOTAL, T3FREE, THYROIDAB in the last 72 hours.  Invalid input(s): FREET3 ------------------------------------------------------------------------------------------------------------------ Recent Labs    02/09/19 1830  FERRITIN 526*    Coagulation profile No results for input(s): INR, PROTIME in the last 168 hours.  Recent Labs    02/09/19 1751 02/10/19 0349  DDIMER 1.22* 1.32*    Cardiac Enzymes No results for input(s): CKMB,  TROPONINI, MYOGLOBIN in the last 168 hours.  Invalid input(s): CK ------------------------------------------------------------------------------------------------------------------    Component Value Date/Time   BNP 41.5 02/09/2019 1751    Micro Results Recent Results (from the past 240 hour(s))  Blood Culture (routine x 2)     Status: None (Preliminary result)   Collection Time: 02/09/19  5:51 PM   Specimen: BLOOD  Result Value Ref Range Status   Specimen Description BLOOD LEFT ANTECUBITAL  Final   Special Requests   Final    BOTTLES DRAWN AEROBIC AND ANAEROBIC Blood Culture results may not be optimal due to an inadequate volume of blood received in culture bottles   Culture   Final    NO GROWTH < 24 HOURS Performed at [facility], [address] [place], [place] [address]    Report Status PENDING  Incomplete  Blood Culture (routine x 2)     Status: None (Preliminary result)   Collection Time: 02/09/19  5:56 PM   Specimen: BLOOD  Result Value Ref Range Status  Specimen Description BLOOD RIGHT ANTECUBITAL  Final   Special Requests   Final    BOTTLES DRAWN AEROBIC AND ANAEROBIC Blood Culture results may not be optimal due to an inadequate volume of blood received in culture bottles   Culture   Final    NO GROWTH < 24 HOURS Performed at [facility], [address], [place], [place] [address]    Report Status PENDING  Incomplete  SARS CORONAVIRUS 2 (TAT 6-24 HRS) Nasopharyngeal Nasopharyngeal Swab     Status: Abnormal   Collection Time: 02/09/19  7:44 PM   Specimen: Nasopharyngeal Swab  Result Value Ref Range Status   SARS Coronavirus 2 POSITIVE (A) NEGATIVE Final    Comment: RESULT CALLED TO, READ BACK BY AND VERIFIED WITH: B SANGALANG,RN 0058 02/10/2019 D BRADLEY (NOTE) SARS-CoV-2 target nucleic acids are DETECTED. The SARS-CoV-2 RNA is generally detectable in upper and lower respiratory specimens during the acute phase of infection. Positive results are  indicative of the presence of SARS-CoV-2 RNA. Clinical correlation with patient history and other diagnostic information is  necessary to determine patient infection status. Positive results do not rule out bacterial infection or co-infection with other viruses.  The expected result is Negative. Fact Sheet for Patients: HairSlick.no Fact Sheet for Healthcare Providers: quierodirigir.com This test is not yet approved or cleared by the [place] FDA and  has been authorized for detection and/or diagnosis of SARS-CoV-2 by FDA under an Emergency Use Authorization (EUA). This EUA will remain  in effect (meaning this test can be used) fo r the duration of the COVID-19 declaration under Section 564(b)(1) of the Act, 21 U.S.C. section 360bbb-3(b)(1), unless the authorization is terminated or revoked sooner. Performed at [facility], [address] [place], [place] [address]     Radiology Reports DG Chest [place] 1 View  Result Date: 02/09/2019 CLINICAL DATA:  Right flank pain for approximately 2 days. Hematuria. EXAM: PORTABLE CHEST 1 VIEW COMPARISON:  PA and lateral chest 04/16/2013. FINDINGS: There is hazy bilateral airspace disease. Heart size is upper normal. No pneumothorax or pleural fusion. IMPRESSION: Hazy bilateral airspace disease could be due to edema or pneumonia. Electronically Signed   By: Drusilla Kanner M.D.   On: 02/09/2019 18:42

## 2019-02-10 NOTE — Plan of Care (Signed)
RN updated pt's daughter Duke Salvia. Pt educated on all medications and plan of care. Pt tolerating treatment.  Problem: Education: Goal: Knowledge of risk factors and measures for prevention of condition will improve Outcome: Progressing   Problem: Coping: Goal: Psychosocial and spiritual needs will be supported Outcome: Progressing   Problem: Respiratory: Goal: Will maintain a patent airway Outcome: Progressing Goal: Complications related to the disease process, condition or treatment will be avoided or minimized Outcome: Progressing

## 2019-02-10 NOTE — ED Notes (Signed)
Patient signed consent form to transfer to [facility].

## 2019-02-10 NOTE — ED Notes (Signed)
ED TO INPATIENT HANDOFF REPORT  ED Nurse Name and Phone #:  Clydene Laming [phone]  S Name/Age/Gender Ed Blalock 48 y.o. female Room/Bed: 025C/025C  Code Status   Code Status: Full Code  Home/SNF/Other Home {Patient oriented x4 Is this baseline? yes  Triage Complete: Triage complete  Chief Complaint Pneumonia due to COVID-19 virus [U07.1, J12.89]  Triage Note Pt works at a nursing home, was swabbed routinely and tested positive per Wed. Only fever at that time was fever. Pt denies SOB, but noted low spO2 at home (79%), was told by MD to come to ED despite not feeling any sx. In ED lobby, spO2 in 80s, returned to 93%on 4L .    Allergies Allergies  Allergen Reactions  . Aspirin Other (See Comments)    Upsets ulcer  . Cyclobenzaprine Other (See Comments)    Unknown  . Hydrocodone Other (See Comments)    Fidgety and jittery  . Mesalamine Other (See Comments)    Upsets ulcer  . Septra [Sulfamethoxazole-Trimethoprim] Nausea And Vomiting  . Latex Rash    Unknown    Level of Care/Admitting Diagnosis ED Disposition    ED Disposition Condition [facility]: [place] [[id number]]  Level of Care: Progressive [102]  Covid Evaluation: Confirmed COVID Positive  Diagnosis: Pneumonia due to COVID-19 virus [[id number]]  Admitting Physician: Shela Leff [[id number]]  Attending Physician: Shela Leff [[id number]]  Estimated length of stay: past midnight tomorrow  Certification:: I certify this patient will need inpatient services for at least 2 midnights       B Medical/Surgery History Past Medical History:  Diagnosis Date  . Arthritis   . Bladder infection   . Bulging discs   . Chronic UTI   . Female bladder prolapse   . Menorrhagia   . Pyelonephritis   . Uterine polyp   . Yeast infection of the vagina    Past Surgical History:  Procedure Laterality Date  . BLADDER SURGERY    . CARPAL TUNNEL RELEASE    . CERVICAL  POLYPECTOMY    . CHOLECYSTECTOMY    . GYNECOLOGIC CRYOSURGERY    . WRIST SURGERY     Carpal Tunnel surgery     A IV Location/Drains/Wounds Patient Lines/Drains/Airways Status   Active Line/Drains/Airways    Name:   Placement date:   Placement time:   Site:   Days:   Peripheral IV 02/09/19 Right Antecubital   02/09/19    1847    Antecubital   1   Peripheral IV 02/09/19 Left Antecubital   02/09/19    1848    Antecubital   1          Intake/Output Last 24 hours No intake or output data in the 24 hours ending 02/10/19 0507  Labs/Imaging Results for orders placed or performed during the hospital encounter of 02/09/19 (from the past 48 hour(s))  CBC WITH DIFFERENTIAL     Status: None   Collection Time: 02/09/19  5:51 PM  Result Value Ref Range   WBC 5.9 4.0 - 10.5 K/uL   RBC 4.28 3.87 - 5.11 MIL/uL   Hemoglobin 12.4 12.0 - 15.0 g/dL   HCT 38.4 36.0 - 46.0 %   MCV 89.7 80.0 - 100.0 fL   MCH 29.0 26.0 - 34.0 pg   MCHC 32.3 30.0 - 36.0 g/dL   RDW 12.5 11.5 - 15.5 %   Platelets 216 150 - 400 K/uL   nRBC 0.0 0.0 -  0.2 %   Neutrophils Relative % 79 %   Neutro Abs 4.7 1.7 - 7.7 K/uL   Lymphocytes Relative 16 %   Lymphs Abs 0.9 0.7 - 4.0 K/uL   Monocytes Relative 5 %   Monocytes Absolute 0.3 0.1 - 1.0 K/uL   Eosinophils Relative 0 %   Eosinophils Absolute 0.0 0.0 - 0.5 K/uL   Basophils Relative 0 %   Basophils Absolute 0.0 0.0 - 0.1 K/uL   nRBC 0 0 /100 WBC   Abs Immature Granulocytes 0.00 0.00 - 0.07 K/uL    Comment: Performed at [facility], [address], [place], [place] [address]  Comprehensive metabolic panel     Status: Abnormal   Collection Time: 02/09/19  5:51 PM  Result Value Ref Range   Sodium 143 135 - 145 mmol/L   Potassium 4.7 3.5 - 5.1 mmol/L   Chloride 107 98 - 111 mmol/L   CO2 25 22 - 32 mmol/L   Glucose, Bld 114 (H) 70 - 99 mg/dL   BUN 9 6 - 20 mg/dL   Creatinine, Ser 3.15 0.44 - 1.00 mg/dL   Calcium 8.8 (L) 8.9 - 10.3 mg/dL   Total Protein 6.8  6.5 - 8.1 g/dL   Albumin 3.1 (L) 3.5 - 5.0 g/dL   AST 22 15 - 41 U/L   ALT 13 0 - 44 U/L   Alkaline Phosphatase 45 38 - 126 U/L   Total Bilirubin 0.7 0.3 - 1.2 mg/dL   GFR calc non Af Amer >60 >60 mL/min   GFR calc Af Amer >60 >60 mL/min   Anion gap 11 5 - 15    Comment: Performed at [facility], [address], [place], [place] [address]  D-dimer, quantitative     Status: Abnormal   Collection Time: 02/09/19  5:51 PM  Result Value Ref Range   D-Dimer, Quant 1.22 (H) 0.00 - 0.50 ug/mL-FEU    Comment: (NOTE) At the manufacturer cut-off of 0.50 ug/mL FEU, this assay has been documented to exclude PE with a sensitivity and negative predictive value of 97 to 99%.  At this time, this assay has not been approved by the FDA to exclude DVT/VTE. Results should be correlated with clinical presentation. Performed at [facility], [address], [place], [place] [address]   Procalcitonin     Status: None   Collection Time: 02/09/19  5:51 PM  Result Value Ref Range   Procalcitonin <0.10 ng/mL    Comment:        Interpretation: PCT (Procalcitonin) <= 0.5 ng/mL: Systemic infection (sepsis) is not likely. Local bacterial infection is possible. (NOTE)       Sepsis PCT Algorithm           Lower Respiratory Tract                                      Infection PCT Algorithm    ----------------------------     ----------------------------         PCT < 0.25 ng/mL                PCT < 0.10 ng/mL         Strongly encourage             Strongly discourage   discontinuation of antibiotics    initiation of antibiotics    ----------------------------     -----------------------------  PCT 0.25 - 0.50 ng/mL            PCT 0.10 - 0.25 ng/mL               OR       >80% decrease in PCT            Discourage initiation of                                            antibiotics      Encourage discontinuation           of antibiotics    ----------------------------      -----------------------------         PCT >= 0.50 ng/mL              PCT 0.26 - 0.50 ng/mL               AND        <80% decrease in PCT             Encourage initiation of                                             antibiotics       Encourage continuation           of antibiotics    ----------------------------     -----------------------------        PCT >= 0.50 ng/mL                  PCT > 0.50 ng/mL               AND         increase in PCT                  Strongly encourage                                      initiation of antibiotics    Strongly encourage escalation           of antibiotics                                     -----------------------------                                           PCT <= 0.25 ng/mL                                                 OR                                        > 80% decrease in PCT  Discontinue / Do not initiate                                             antibiotics Performed at [facility], [address], [place], [place] [address]   Lactate dehydrogenase     Status: Abnormal   Collection Time: 02/09/19  5:51 PM  Result Value Ref Range   LDH 198 (H) 98 - 192 U/L    Comment: Performed at [facility], [address], [place], [place] [address]  Fibrinogen     Status: Abnormal   Collection Time: 02/09/19  5:51 PM  Result Value Ref Range   Fibrinogen 724 (H) 210 - 475 mg/dL    Comment: Performed at [facility], [address] [place], [place] [address]  Triglycerides     Status: None   Collection Time: 02/09/19  5:51 PM  Result Value Ref Range   Triglycerides 90 <150 mg/dL    Comment: Performed at [facility], [address] [place], [place] [address]  Brain natriuretic peptide     Status: None   Collection Time: 02/09/19  5:51 PM  Result Value Ref Range   B Natriuretic Peptide 41.5 0.0 - 100.0 pg/mL    Comment: Performed at [facility], [address], [place], [place] [address]  Lactic acid, plasma     Status: None   Collection Time: 02/09/19  6:15 PM  Result Value Ref Range   Lactic Acid, Venous 1.0 0.5 - 1.9 mmol/L    Comment: Performed at [facility], [address], [place], [place] [address]  C-reactive protein     Status: Abnormal   Collection Time: 02/09/19  6:30 PM  Result Value Ref Range   CRP 24.0 (H) <1.0 mg/dL    Comment: Performed at [facility], [address] [place], [place] [address]  Ferritin     Status: Abnormal   Collection Time: 02/09/19  6:30 PM  Result Value Ref Range   Ferritin 526 (H) 11 - 307 ng/mL    Comment: Performed at [facility], [address], [place], [place] [address]  I-Stat beta hCG blood, ED     Status: None   Collection Time: 02/09/19  6:31 PM  Result Value Ref Range   I-stat hCG, quantitative <5.0 <5 mIU/mL   Comment 3            Comment:   GEST. AGE      CONC.  (mIU/mL)   <=1 WEEK        5 - 50     2 WEEKS       50 - 500     3 WEEKS       100 - 10,000     4 WEEKS     1,000 - 30,000        FEMALE AND NON-PREGNANT FEMALE:     LESS THAN 5 mIU/mL   HIV Antibody (routine testing w rflx)     Status: None   Collection Time: 02/09/19  6:55 PM  Result Value Ref Range   HIV Screen 4th Generation wRfx NON REACTIVE NON REACTIVE    Comment: Performed at [facility], [address] [place], [place] [address]  Lactic acid, plasma     Status: None  Collection Time: 02/09/19  7:00 PM  Result Value Ref Range   Lactic Acid, Venous 1.0 0.5 - 1.9 mmol/L    Comment: Performed at [facility], [address] [place], [place] [address]  POC SARS Coronavirus 2 Ag-ED -     Status: None   Collection Time: 02/09/19  7:40 PM  Result Value Ref Range   SARS Coronavirus 2 Ag NEGATIVE NEGATIVE    Comment: (NOTE) SARS-CoV-2 antigen NOT DETECTED.  Negative results are presumptive.  Negative results do not preclude SARS-CoV-2 infection and should not be used as the sole basis  for treatment or other patient management decisions, including infection  control decisions, particularly in the presence of clinical signs and  symptoms consistent with COVID-19, or in those who have been in contact with the virus.  Negative results must be combined with clinical observations, patient history, and epidemiological information. The expected result is Negative. Fact Sheet for Patients: https://sanders-williams.net/ Fact Sheet for Healthcare Providers: https://martinez.com/ This test is not yet approved or cleared by the [place] FDA and  has been authorized for detection and/or diagnosis of SARS-CoV-2 by FDA under an Emergency Use Authorization (EUA).  This EUA will remain in effect (meaning this test can be used) for the duration of  the COVID-19 de claration under Section 564(b)(1) of the Act, 21 U.S.C. section 360bbb-3(b)(1), unless the authorization is terminated or revoked sooner.   SARS CORONAVIRUS 2 (TAT 6-24 HRS) Nasopharyngeal Nasopharyngeal Swab     Status: Abnormal   Collection Time: 02/09/19  7:44 PM   Specimen: Nasopharyngeal Swab  Result Value Ref Range   SARS Coronavirus 2 POSITIVE (A) NEGATIVE    Comment: RESULT CALLED TO, READ BACK BY AND VERIFIED WITH: B Bula Cavalieri,RN 0058 02/10/2019 D BRADLEY (NOTE) SARS-CoV-2 target nucleic acids are DETECTED. The SARS-CoV-2 RNA is generally detectable in upper and lower respiratory specimens during the acute phase of infection. Positive results are indicative of the presence of SARS-CoV-2 RNA. Clinical correlation with patient history and other diagnostic information is  necessary to determine patient infection status. Positive results do not rule out bacterial infection or co-infection with other viruses.  The expected result is Negative. Fact Sheet for Patients: HairSlick.no Fact Sheet for Healthcare  Providers: quierodirigir.com This test is not yet approved or cleared by the [place] FDA and  has been authorized for detection and/or diagnosis of SARS-CoV-2 by FDA under an Emergency Use Authorization (EUA). This EUA will remain  in effect (meaning this test can be used) fo r the duration of the COVID-19 declaration under Section 564(b)(1) of the Act, 21 U.S.C. section 360bbb-3(b)(1), unless the authorization is terminated or revoked sooner. Performed at [facility], [address] [place], [place] [address]   CBC with Differential/Platelet     Status: None   Collection Time: 02/10/19  3:49 AM  Result Value Ref Range   WBC 5.5 4.0 - 10.5 K/uL   RBC 4.54 3.87 - 5.11 MIL/uL   Hemoglobin 12.9 12.0 - 15.0 g/dL   HCT 13.0 86.5 - 78.4 %   MCV 89.4 80.0 - 100.0 fL   MCH 28.4 26.0 - 34.0 pg   MCHC 31.8 30.0 - 36.0 g/dL   RDW 69.6 29.5 - 28.4 %   Platelets 232 150 - 400 K/uL   nRBC 0.0 0.0 - 0.2 %   Neutrophils Relative % 77 %   Neutro Abs 4.2 1.7 - 7.7 K/uL   Lymphocytes Relative 17 %   Lymphs  Abs 0.9 0.7 - 4.0 K/uL   Monocytes Relative 5 %   Monocytes Absolute 0.3 0.1 - 1.0 K/uL   Eosinophils Relative 0 %   Eosinophils Absolute 0.0 0.0 - 0.5 K/uL   Basophils Relative 0 %   Basophils Absolute 0.0 0.0 - 0.1 K/uL   Immature Granulocytes 1 %   Abs Immature Granulocytes 0.04 0.00 - 0.07 K/uL    Comment: Performed at [facility], [address] [place], [place] [address]  Comprehensive metabolic panel     Status: Abnormal   Collection Time: 02/10/19  3:49 AM  Result Value Ref Range   Sodium 142 135 - 145 mmol/L   Potassium 4.5 3.5 - 5.1 mmol/L   Chloride 106 98 - 111 mmol/L   CO2 27 22 - 32 mmol/L   Glucose, Bld 170 (H) 70 - 99 mg/dL   BUN 9 6 - 20 mg/dL   Creatinine, Ser 6.040.86 0.44 - 1.00 mg/dL   Calcium 9.1 8.9 - 54.010.3 mg/dL   Total Protein 7.2 6.5 - 8.1 g/dL   Albumin 3.1 (L) 3.5 - 5.0 g/dL   AST 23 15 - 41 U/L   ALT 13 0 - 44  U/L   Alkaline Phosphatase 47 38 - 126 U/L   Total Bilirubin 0.3 0.3 - 1.2 mg/dL   GFR calc non Af Amer >60 >60 mL/min   GFR calc Af Amer >60 >60 mL/min   Anion gap 9 5 - 15    Comment: Performed at [facility], [address] [place], [place] [address]  C-reactive protein     Status: Abnormal   Collection Time: 02/10/19  3:49 AM  Result Value Ref Range   CRP 24.6 (H) <1.0 mg/dL    Comment: Performed at [facility], [address] [place], [place] [address]  D-dimer, quantitative (not at [facility])     Status: Abnormal   Collection Time: 02/10/19  3:49 AM  Result Value Ref Range   D-Dimer, Quant 1.32 (H) 0.00 - 0.50 ug/mL-FEU    Comment: (NOTE) At the manufacturer cut-off of 0.50 ug/mL FEU, this assay has been documented to exclude PE with a sensitivity and negative predictive value of 97 to 99%.  At this time, this assay has not been approved by the FDA to exclude DVT/VTE. Results should be correlated with clinical presentation. Performed at [facility], [address] [place], [place] [address]   Lactate dehydrogenase     Status: Abnormal   Collection Time: 02/10/19  3:49 AM  Result Value Ref Range   LDH 225 (H) 98 - 192 U/L    Comment: Performed at [facility], [address] [place], [place] [address]  ABO/Rh     Status: None   Collection Time: 02/10/19  4:05 AM  Result Value Ref Range   ABO/RH(D)      O POS Performed at [facility], [address] [place], [place] [address]    DG Chest Port 1 View  Result Date: 02/09/2019 CLINICAL DATA:  Right flank pain for approximately 2 days. Hematuria. EXAM: PORTABLE CHEST 1 VIEW COMPARISON:  PA and lateral chest 04/16/2013. FINDINGS: There is hazy bilateral airspace disease. Heart size is upper normal. No pneumothorax or pleural fusion. IMPRESSION: Hazy bilateral airspace disease could be due to edema or pneumonia. Electronically Signed   By: Drusilla Kannerhomas  Dalessio M.D.   On: 02/09/2019 18:42     Pending Labs [facility] (From  admission, onward)    Start     Ordered   02/10/19 0500  CBC with Differential/Platelet  Daily,   R     02/09/19 2109   02/10/19 0500  Comprehensive metabolic panel  Daily,   R     02/09/19 2109   02/10/19 0500  C-reactive protein  Daily,   R     02/09/19 2109   02/10/19 0500  D-dimer, quantitative (not at [facility])  Daily,   R     02/09/19 2109   02/10/19 0500  Lactate dehydrogenase  Daily,   R     02/09/19 2109   02/09/19 1755  Urinalysis, Routine w reflex microscopic  Once,   STAT     02/09/19 1754   02/09/19 1755  Urine culture  Once,   STAT     02/09/19 1754   02/09/19 1751  Blood Culture (routine x 2)  BLOOD CULTURE X 2,   STAT     02/09/19 1751          Vitals/Pain Today's Vitals   02/10/19 0330 02/10/19 0400 02/10/19 0430 02/10/19 0435  BP: 116/64 (!) 114/59 110/64   Pulse: 83 90 81   Resp: Temp:      TempSrc:      SpO2: 92% 95% 97%   PainSc:    0-No pain    Isolation Precautions Airborne and Contact precautions  Medications Medications  ALPRAZolam (XANAX) tablet 1 mg (1 mg Oral Given 02/10/19 0009)  albuterol (VENTOLIN HFA) 108 (90 Base) MCG/ACT inhaler 2 puff (has no administration in time range)  enoxaparin (LOVENOX) injection 40 mg (40 mg Subcutaneous Given 02/10/19 0003)  acetaminophen (TYLENOL) tablet 650 mg (has no administration in time range)  dexamethasone (DECADRON) injection 6 mg (6 mg Intravenous Given 02/10/19 0003)  guaiFENesin-dextromethorphan (ROBITUSSIN DM) 100-10 MG/5ML syrup 10 mL (has no administration in time range)  ascorbic acid (VITAMIN C) tablet 500 mg (500 mg Oral Not Given 02/10/19 0003)  zinc sulfate capsule 220 mg (220 mg Oral Not Given 02/10/19 0004)  cholecalciferol (VITAMIN D3) tablet 2,000 Units (2,000 Units Oral Not Given 02/10/19 0004)  oxyCODONE-acetaminophen (PERCOCET/ROXICET) 5-325 MG per tablet 1 tablet (has no administration in time range)    And  oxyCODONE (Oxy  IR/ROXICODONE) immediate release tablet 5 mg (5 mg Oral Given 02/10/19 0009)  remdesivir 100 mg in sodium chloride 0.9 % 100 mL IVPB (has no administration in time range)  remdesivir 200 mg in sodium chloride 0.9% 250 mL IVPB (0 mg Intravenous Stopped 02/10/19 0506)    Mobility walks     Focused Assessments O2 6 lpm/[place]   R Recommendations: See Admitting Provider Note  Report given to:   Additional Notes:

## 2019-02-11 DIAGNOSIS — Z87440 Personal history of urinary (tract) infections: Secondary | ICD-10-CM

## 2019-02-11 LAB — HEMOGLOBIN A1C
Hgb A1c MFr Bld: 6.5 % — ABNORMAL HIGH (ref 4.8–5.6)
Mean Plasma Glucose: 139.85 mg/dL

## 2019-02-11 LAB — GLUCOSE, CAPILLARY
Glucose-Capillary: 229 mg/dL — ABNORMAL HIGH (ref 70–99)
Glucose-Capillary: 195 mg/dL — ABNORMAL HIGH (ref 70–99)
Glucose-Capillary: 275 mg/dL — ABNORMAL HIGH (ref 70–99)
Glucose-Capillary: 251 mg/dL — ABNORMAL HIGH (ref 70–99)
Glucose-Capillary: 259 mg/dL — ABNORMAL HIGH (ref 70–99)

## 2019-02-11 LAB — COMPREHENSIVE METABOLIC PANEL
ALT: 14 U/L (ref 0–44)
AST: 20 U/L (ref 15–41)
Albumin: 3.2 g/dL — ABNORMAL LOW (ref 3.5–5.0)
Alkaline Phosphatase: 50 U/L (ref 38–126)
Anion gap: 11 (ref 5–15)
BUN: 15 mg/dL (ref 6–20)
CO2: 25 mmol/L (ref 22–32)
Calcium: 9.1 mg/dL (ref 8.9–10.3)
Chloride: 108 mmol/L (ref 98–111)
Creatinine, Ser: 0.75 mg/dL (ref 0.44–1.00)
GFR calc Af Amer: >60 mL/min (ref >60)
GFR calc non Af Amer: >60 mL/min (ref >60)
Glucose, Bld: 195 mg/dL — ABNORMAL HIGH (ref 70–99)
Potassium: 3.9 mmol/L (ref 3.5–5.1)
Sodium: 144 mmol/L (ref 135–145)
Total Bilirubin: 1.1 mg/dL (ref 0.3–1.2)
Total Protein: 7.2 g/dL (ref 6.5–8.1)

## 2019-02-11 LAB — CBC WITH DIFFERENTIAL/PLATELET
Abs Immature Granulocytes: 0.08 10*3/uL — ABNORMAL HIGH (ref 0.00–0.07)
Basophils Absolute: 0 10*3/uL (ref 0.0–0.1)
Basophils Relative: 1 %
Eosinophils Absolute: 0 10*3/uL (ref 0.0–0.5)
Eosinophils Relative: 0 %
HCT: 38.3 % (ref 36.0–46.0)
Hemoglobin: 12.6 g/dL (ref 12.0–15.0)
Immature Granulocytes: 2 %
Lymphocytes Relative: 21 %
Lymphs Abs: 1.1 10*3/uL (ref 0.7–4.0)
MCH: 28.8 pg (ref 26.0–34.0)
MCHC: 32.9 g/dL (ref 30.0–36.0)
MCV: 87.6 fL (ref 80.0–100.0)
Monocytes Absolute: 0.4 10*3/uL (ref 0.1–1.0)
Monocytes Relative: 7 %
Neutro Abs: 3.7 10*3/uL (ref 1.7–7.7)
Neutrophils Relative %: 69 %
Platelets: 297 10*3/uL (ref 150–400)
RBC: 4.37 MIL/uL (ref 3.87–5.11)
RDW: 12.5 % (ref 11.5–15.5)
WBC: 5.3 10*3/uL (ref 4.0–10.5)
nRBC: 0 % (ref 0.0–0.2)

## 2019-02-11 LAB — C-REACTIVE PROTEIN: CRP: 14.6 mg/dL — ABNORMAL HIGH (ref <1.0)

## 2019-02-11 LAB — D-DIMER, QUANTITATIVE: D-Dimer, Quant: 1.38 ug/mL-FEU — ABNORMAL HIGH (ref 0.00–0.50)

## 2019-02-11 MED ORDER — INSULIN ASPART 100 UNIT/ML ~~LOC~~ SOLN
0.0000 [IU] | Freq: Three times a day (TID) | SUBCUTANEOUS | Status: DC
  Administered 2019-02-11: 11 [IU] via SUBCUTANEOUS
  Administered 2019-02-12 (×3): 7 [IU] via SUBCUTANEOUS
  Administered 2019-02-13: 11 [IU] via SUBCUTANEOUS
  Administered 2019-02-13: 4 [IU] via SUBCUTANEOUS

## 2019-02-11 MED ORDER — ENOXAPARIN SODIUM 60 MG/0.6ML ~~LOC~~ SOLN
60.0000 mg | SUBCUTANEOUS | Status: DC
  Administered 2019-02-11 – 2019-02-12 (×2): 60 mg via SUBCUTANEOUS
  Filled 2019-02-11 (×3): qty 0.6

## 2019-02-11 MED ORDER — FUROSEMIDE 10 MG/ML IJ SOLN
40.0000 mg | Freq: Every day | INTRAMUSCULAR | Status: DC
  Administered 2019-02-11: 40 mg via INTRAVENOUS
  Filled 2019-02-11 (×2): qty 4

## 2019-02-11 MED ORDER — AMLODIPINE BESYLATE 5 MG PO TABS
5.0000 mg | ORAL_TABLET | Freq: Every day | ORAL | Status: DC
  Administered 2019-02-11 – 2019-02-13 (×3): 5 mg via ORAL
  Filled 2019-02-11 (×3): qty 1

## 2019-02-11 NOTE — Evaluation (Signed)
Physical Therapy Evaluation Patient Details Name: Cathy Rowland MRN: [id number] DOB: [birth year] Today's Date: 02/11/2019   History of Present Illness  48 y.o. female with medical history significant of recurrent UTIs, pyelonephritis, arthritis, bladder prolapse, presenting to the ED 02/09/19 from home for evaluation of hypoxia.  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 Presented to ED 02/09/19 due to hypoxia.   Clinical Impression   Pt admitted with above diagnosis. Patient on 3L at rest with sats 95%; walked to bathroom on 3L with sats decr to 88%; incr to 4L to walk in hallway x 600 ft with lowest sat 91%. Initiated education with teach back re: positioning, use of IS, and hydration. Patient has a very physically demanding job and currently desaturates with simple walking.  Pt currently with functional limitations due to the deficits listed below (see PT Problem List). Pt will benefit from skilled PT to increase their independence and safety with mobility to allow discharge to the venue listed below.       Follow Up Recommendations No PT follow up    Equipment Recommendations  None recommended by PT    Recommendations for Other Services       Precautions / Restrictions Precautions Precautions: Other (comment) Precaution Comments: monitor sats and HR Restrictions Weight Bearing Restrictions: No      Mobility  Bed Mobility               General bed mobility comments: up in recliner  Transfers Overall transfer level: Independent               General transfer comment: from recliner & standard toilet  Ambulation/Gait Ambulation/Gait assistance: Independent Gait Distance (Feet): 600 Feet Assistive device: None Gait Pattern/deviations: Step-through pattern;Decreased stride length;Wide base of support Gait velocity: slow   General Gait Details: feels she is walking her normal speed; wide BOS due to  body habitus; on 3L dropped to 88% after 20 ft and incr to 4L for remainder of walk with lowest 91%  Stairs            Wheelchair Mobility    Modified Rankin (Stroke Patients Only)       Balance Overall balance assessment: No apparent balance deficits (not formally assessed)                                           Pertinent Vitals/Pain Pain Assessment: No/denies pain    Home Living Family/patient expects to be discharged to:: Private residence Living Arrangements: Spouse/significant other;Children;Non-relatives/Friends(youngest dtr 49; grandson 75) Available Help at Discharge: Family;Available 24 hours/day Type of Home: Mobile home Home Access: Stairs to enter Entrance Stairs-Rails: Right;Left;None Entrance Stairs-Number of Steps: 5 Home Layout: One level Home Equipment: Walker - 2 wheels;Shower seat;Tub bench;Cane - single point(equipment was for her grandmother)      Prior Function Level of Independence: Independent         Comments: works as a Quarry manager at [employer];      Journalist, newspaper        Extremity/Trunk Assessment   Upper Extremity Assessment Upper Extremity Assessment: Overall WFL for tasks assessed    Lower Extremity Assessment Lower Extremity Assessment: Overall WFL for tasks assessed    Cervical / Trunk Assessment Cervical / Trunk Assessment: Other exceptions Cervical / Trunk Exceptions: obesity  Communication   Communication: No  difficulties  Cognition Arousal/Alertness: Awake/alert(drowsy initially) Behavior During Therapy: WFL for tasks assessed/performed Overall Cognitive Status: Within Functional Limits for tasks assessed                                        General Comments General comments (skin integrity, edema, etc.): Educated on steps she can do to help improve: positioning (states she can't prone, encouraged upright sitting over sidelying), use of IS, hydration, activity as able with monitor/IV/O2.  Encouraged to call for assist to walk to bathroom (and even walk in halls).     Exercises Other Exercises Other Exercises: pt demonstrated proper use of incentive spirometer x 3 reps with max 1250 ml pulled; educated on frequency   Assessment/Plan    PT Assessment Patient needs continued PT services  PT Problem List Decreased activity tolerance;Decreased mobility;Decreased knowledge of precautions;Cardiopulmonary status limiting activity;Obesity       PT Treatment Interventions Gait training;Functional mobility training;Therapeutic activities;Therapeutic exercise;Patient/family education    PT Goals (Current goals can be found in the Care Plan section)  Acute Rehab PT Goals Patient Stated Goal: be able to care for her dad if he tests positive PT Goal Formulation: With patient Time For Goal Achievement: 02/25/19 Potential to Achieve Goals: Good    Frequency Min 3X/week   Barriers to discharge        Co-evaluation               AM-PAC PT "6 Clicks" Mobility  Outcome Measure Help needed turning from your back to your side while in a flat bed without using bedrails?: None Help needed moving from lying on your back to sitting on the side of a flat bed without using bedrails?: None Help needed moving to and from a bed to a chair (including a wheelchair)?: None Help needed standing up from a chair using your arms (e.g., wheelchair or bedside chair)?: None Help needed to walk in hospital room?: None Help needed climbing 3-5 steps with a railing? : None 6 Click Score: 24    End of Session Equipment Utilized During Treatment: Oxygen Activity Tolerance: Patient tolerated treatment well Patient left: in chair;with call bell/phone within reach Nurse Communication: Other (comment);Mobility status(pt had gone to [facility] alone and very tangled up) PT Visit Diagnosis: Difficulty in walking, not elsewhere classified (R26.2)    Time: 0175-1025 PT Time Calculation (min) (ACUTE ONLY):  45 min   Charges:   PT Evaluation $PT Eval Low Complexity: 1 Low PT Treatments $Gait Training: 8-22 mins $Self Care/Home Management: 8-22         Jerolyn Center, PT Pager [phone]   Zena Amos 02/11/2019, 10:07 AM

## 2019-02-11 NOTE — Progress Notes (Signed)
Inpatient Diabetes Program Recommendations  AACE/ADA: New Consensus Statement on Inpatient Glycemic Control (2015)  Target Ranges:  Prepandial:   less than 140 mg/dL      Peak postprandial:   less than 180 mg/dL (1-2 hours)      Critically ill patients:  140 - 180 mg/dL   Lab Results  Component Value Date   GLUCAP 195 (H) 02/11/2019   HGBA1C 6.5 (H) 02/11/2019    Review of Glycemic Control Results for Cathy Rowland, Cathy Rowland (MRN [id number]) as of 02/11/2019 10:11  Ref. Range 02/10/2019 15:53 02/10/2019 20:32 02/11/2019 08:11  Glucose-Capillary Latest Ref Range: 70 - 99 mg/dL 179 (H) 265 (H) 195 (H)   Diabetes history: None noted Outpatient Diabetes medications: None Current orders for Inpatient glycemic control:  Novolog sensitive tid with meals Solumedrol 60 mg IV q 12 hrs  Inpatient Diabetes Program Recommendations:    If appropriate, consider adding Novolog meal coverage 3 units tid with meals (hold if patient eats less than 50%).    Thanks  Adah Perl, RN, BC-ADM Inpatient Diabetes Coordinator Pager [phone] (8a-5p)

## 2019-02-11 NOTE — Progress Notes (Signed)
PROGRESS NOTE                                                                                                                                                                                                             Patient Demographics:    Cathy Rowland, is a 48 y.o. female, DOB - 05/30/1970, VQM:[id number]  Outpatient Primary MD for the patient is Richmond Campbell., PA-C   Admit date - 02/09/2019   LOS - 2  Chief Complaint  Patient presents with  . Covid/SOB       Brief Narrative: Patient is a 48 y.o. 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   Subjective:    Cathy Rowland feels somewhat better today-she has less shortness of breath-she was titrated down to 3 L of oxygen this morning.   Assessment  & Plan :   Acute Hypoxic Resp Failure due to Covid 19 Viral pneumonia: Improved-down to 3 L of oxygen.  Continue steroids and remdesivir.  Patient is s/p Actemra on 12/16.  Continue with attempts to slowly titrate down FiO2 as much as possible.  Fever: afebrile  O2 requirements:  SpO2: 94 % O2 Flow Rate (L/min): 3 L/min   COVID-19 Labs: Recent Labs    02/09/19 1751 02/09/19 1830 02/10/19 0349 02/11/19 0104  DDIMER 1.22*  --  1.32* 1.38*  FERRITIN  --  526*  --   --   LDH 198*  --  225*  --   CRP  --  24.0* 24.6* 14.6*       Component Value Date/Time   BNP 41.5 02/09/2019 1751    Recent Labs  Lab 02/09/19 1751  PROCALCITON <0.10    Lab Results  Component Value Date   SARSCOV2NAA POSITIVE (A) 02/09/2019     COVID-19 Medications: Steroids: 12/15>>  Remdesivir: 12/15>> Actemra: 12/16>> x1  Other medications: Diuretics:Euvolemic-continue Lasix 40 mg IV daily to maintain negative balance. Antibiotics:Not needed as  no evidence of bacterial infection Insulin: CBGs on the higher side-change to resistant SSI.  A1c 6.5.  Will require diet/exercise modification and recheck A1c in 3 months in the outpatient setting.  Prone/Incentive Spirometry: encouraged patient to  lie prone for 3-4 hours at a time for a total of 16 hours a day, and to encourage incentive spirometry use 3-4/hour  DVT Prophylaxis  :  Lovenox  Hypertension: Pressure fluctuating but mostly on the higher side-resume amlodipine.  History of recurrent UTIs: Apparently was treated less than a week back-currently she has no symptoms of UTI.  Anxiety: Continue Xanax as needed  Chronic pain: Continue as needed Percocet  Obesity: Estimated body mass index is 43.94 kg/m as calculated from the following:   Height as of this encounter:  (1.626 m).   Weight as of this encounter: 116.1 kg.   Consults  :  None  Procedures  :  None  ABG:    Component Value Date/Time   TCO2 26 03/06/2010 1031    Vent Settings: N/A  Condition - Extremely Guarded  Family Communication  : Unable to leave a voicemail-as a voicemail not set up.  Code Status :  Full Code  Diet :  Diet Order            Diet Heart Room service appropriate? Yes; Fluid consistency: Thin  Diet effective now               Disposition Plan  :  Remain hospitalized  Barriers to discharge: Hypoxia requiring O2 supplementation/complete 5 days of IV Remdesivir  Antimicorbials  :    Anti-infectives (From admission, onward)   Start     Dose/Rate Route Frequency Ordered Stop   02/10/19 1000  remdesivir 100 mg in sodium chloride 0.9 % 100 mL IVPB  Status:  Discontinued     100 mg 200 mL/hr over 30 Minutes Intravenous Daily 02/09/19 2109 02/09/19 2139   02/10/19 1000  remdesivir 100 mg in sodium chloride 0.9 % 100 mL IVPB     100 mg 200 mL/hr over 30 Minutes Intravenous Daily 02/10/19 0421 02/14/19 0959   02/10/19 0500  remdesivir 200 mg in sodium chloride 0.9% 250 mL IVPB      200 mg 580 mL/hr over 30 Minutes Intravenous Once 02/10/19 0421 02/10/19 0506   02/09/19 2115  remdesivir 200 mg in sodium chloride 0.9% 250 mL IVPB  Status:  Discontinued     200 mg 580 mL/hr over 30 Minutes Intravenous Once 02/09/19 2109 02/09/19 2139      Inpatient Medications  Scheduled Meds: . vitamin C  500 mg Oral Daily  . cholecalciferol  2,000 Units Oral Daily  . enoxaparin (LOVENOX) injection  60 mg Subcutaneous Q24H  . furosemide  40 mg Intravenous Daily  . insulin aspart  0-9 Units Subcutaneous TID WC  . methylPREDNISolone (SOLU-MEDROL) injection  60 mg Intravenous Q12H  . zinc sulfate  220 mg Oral Daily   Continuous Infusions: . remdesivir 100 mg in NS 100 mL 100 mg (02/11/19 0900)   PRN Meds:.acetaminophen, albuterol, ALPRAZolam, guaiFENesin-dextromethorphan, oxyCODONE-acetaminophen **AND** oxyCODONE   Time Spent in minutes 25  See all Orders from today for further details   Jeoffrey Massed M.D on 02/11/2019 at 2:30 PM  To page go to [email or web address] - use universal password  [facility] -  Office  [phone]    Objective:   Vitals:   02/11/19 0056 02/11/19 0523 02/11/19 0830 02/11/19 1117  BP: (!) 150/79 (!) 141/77 (!) 143/93 114/73  Pulse: 81 86 87 83  Resp: 19 (!) Temp: 98.5 F (36.9 C) 98.1 F (36.7 C) 98.1 F (36.7 C) 98 F (36.7 C)  TempSrc: Oral Oral Oral Oral  SpO2: 94% 93% 93% 94%  Weight:      Height:        Wt Readings from Last 3 Encounters:  02/10/19 116.1 kg  01/03/18 121.1 kg  02/06/17 119.7 kg     Intake/Output Summary (Last 24 hours) at 02/11/2019 1430 Last data filed at 02/11/2019 0916 Gross per 24 hour  Intake 340 ml  Output 1 ml  Net 339 ml     Physical Exam Gen Exam:Alert awake-not in any distress HEENT:atraumatic, normocephalic Chest: B/L clear to auscultation anteriorly CVS:S1S2 regular Abdomen:soft non tender, non distended Extremities:no edema Neurology: Non focal Skin: no rash   Data Review:    CBC Recent Labs  Lab 02/09/19 1751 02/10/19 0349 02/11/19 0104  WBC 5.9 5.5 5.3  HGB 12.4 12.9 12.6  HCT 38.4 40.6 38.3  PLT 216 232 297  MCV 89.7 89.4 87.6  MCH 29.0 28.4 28.8  MCHC 32.3 31.8 32.9  RDW 12.5 12.5 12.5  LYMPHSABS 0.9 0.9 1.1  MONOABS 0.3 0.3 0.4  EOSABS 0.0 0.0 0.0  BASOSABS 0.0 0.0 0.0    Chemistries  Recent Labs  Lab 02/09/19 1751 02/10/19 0349 02/11/19 0104  NA 143 142 144  K 4.7 4.5 3.9  CL 107 106 108  CO2 25 27 25   GLUCOSE 114* 170* 195*  BUN 9 9 15   CREATININE 0.93 0.86 0.75  CALCIUM 8.8* 9.1 9.1  AST 22 23 20   ALT 13 13 14   ALKPHOS 45 47 50  BILITOT 0.7 0.3 1.1   ------------------------------------------------------------------------------------------------------------------ Recent Labs    02/09/19 1751  TRIG 90    Lab Results  Component Value Date   HGBA1C 6.5 (H) 02/11/2019   ------------------------------------------------------------------------------------------------------------------ No results for input(s): TSH, T4TOTAL, T3FREE, THYROIDAB in the last 72 hours.  Invalid input(s): FREET3 ------------------------------------------------------------------------------------------------------------------ Recent Labs    02/09/19 1830  FERRITIN 526*    Coagulation profile No results for input(s): INR, PROTIME in the last 168 hours.  Recent Labs    02/10/19 0349 02/11/19 0104  DDIMER 1.32* 1.38*    Cardiac Enzymes No results for input(s): CKMB, TROPONINI, MYOGLOBIN in the last 168 hours.  Invalid input(s): CK ------------------------------------------------------------------------------------------------------------------    Component Value Date/Time   BNP 41.5 02/09/2019 1751    Micro Results Recent Results (from the past 240 hour(s))  Blood Culture (routine x 2)     Status: None (Preliminary result)   Collection Time: 02/09/19  5:51 PM   Specimen: BLOOD  Result Value Ref Range Status    Specimen Description BLOOD LEFT ANTECUBITAL  Final   Special Requests   Final    BOTTLES DRAWN AEROBIC AND ANAEROBIC Blood Culture results may not be optimal due to an inadequate volume of blood received in culture bottles   Culture   Final    NO GROWTH 2 DAYS Performed at [facility] [address], [place], [place] [address]    Report Status PENDING  Incomplete  Blood Culture (routine x 2)     Status: None (Preliminary result)   Collection Time: 02/09/19  5:56 PM   Specimen: BLOOD  Result Value Ref Range Status   Specimen Description BLOOD RIGHT ANTECUBITAL  Final   Special Requests   Final    BOTTLES DRAWN AEROBIC AND ANAEROBIC Blood Culture results may not be optimal due to an inadequate volume of blood received in culture bottles   Culture   Final    NO GROWTH 2 DAYS Performed at [facility], [place]  [address] [place], [place] [address]    Report Status PENDING  Incomplete  SARS CORONAVIRUS 2 (TAT 6-24 HRS) Nasopharyngeal Nasopharyngeal Swab     Status: Abnormal   Collection Time: 02/09/19  7:44 PM   Specimen: Nasopharyngeal Swab  Result Value Ref Range Status   SARS Coronavirus 2 POSITIVE (A) NEGATIVE Final    Comment: RESULT CALLED TO, READ BACK BY AND VERIFIED WITH: B SANGALANG,RN 0058 02/10/2019 D BRADLEY (NOTE) SARS-CoV-2 target nucleic acids are DETECTED. The SARS-CoV-2 RNA is generally detectable in upper and lower respiratory specimens during the acute phase of infection. Positive results are indicative of the presence of SARS-CoV-2 RNA. Clinical correlation with patient history and other diagnostic information is  necessary to determine patient infection status. Positive results do not rule out bacterial infection or co-infection with other viruses.  The expected result is Negative. Fact Sheet for Patients: HairSlick.nohttps://www.fda.gov/media/138098/download Fact Sheet for Healthcare Providers: quierodirigir.comhttps://www.fda.gov/media/138095/download This test is not yet  approved or cleared by the [place] FDA and  has been authorized for detection and/or diagnosis of SARS-CoV-2 by FDA under an Emergency Use Authorization (EUA). This EUA will remain  in effect (meaning this test can be used) fo r the duration of the COVID-19 declaration under Section 564(b)(1) of the Act, 21 U.S.C. section 360bbb-3(b)(1), unless the authorization is terminated or revoked sooner. Performed at [facility], [address] [place], [place] [address]     Radiology Reports DG Chest ZillahPort 1 View  Result Date: 02/09/2019 CLINICAL DATA:  Right flank pain for approximately 2 days. Hematuria. EXAM: PORTABLE CHEST 1 VIEW COMPARISON:  PA and lateral chest 04/16/2013. FINDINGS: There is hazy bilateral airspace disease. Heart size is upper normal. No pneumothorax or pleural fusion. IMPRESSION: Hazy bilateral airspace disease could be due to edema or pneumonia. Electronically Signed   By: Drusilla Kannerhomas  Dalessio M.D.   On: 02/09/2019 18:42

## 2019-02-12 LAB — GLUCOSE, CAPILLARY
Glucose-Capillary: 217 mg/dL — ABNORMAL HIGH (ref 70–99)
Glucose-Capillary: 235 mg/dL — ABNORMAL HIGH (ref 70–99)
Glucose-Capillary: 216 mg/dL — ABNORMAL HIGH (ref 70–99)

## 2019-02-12 LAB — COMPREHENSIVE METABOLIC PANEL
ALT: 15 U/L (ref 0–44)
AST: 20 U/L (ref 15–41)
Albumin: 3.3 g/dL — ABNORMAL LOW (ref 3.5–5.0)
Alkaline Phosphatase: 48 U/L (ref 38–126)
Anion gap: 11 (ref 5–15)
BUN: 24 mg/dL — ABNORMAL HIGH (ref 6–20)
CO2: 27 mmol/L (ref 22–32)
Calcium: 9.2 mg/dL (ref 8.9–10.3)
Chloride: 110 mmol/L (ref 98–111)
Creatinine, Ser: 0.85 mg/dL (ref 0.44–1.00)
GFR calc Af Amer: >60 mL/min (ref >60)
GFR calc non Af Amer: >60 mL/min (ref >60)
Glucose, Bld: 189 mg/dL — ABNORMAL HIGH (ref 70–99)
Potassium: 3.6 mmol/L (ref 3.5–5.1)
Sodium: 148 mmol/L — ABNORMAL HIGH (ref 135–145)
Total Bilirubin: 0.7 mg/dL (ref 0.3–1.2)
Total Protein: 7.3 g/dL (ref 6.5–8.1)

## 2019-02-12 LAB — CBC WITH DIFFERENTIAL/PLATELET
Abs Immature Granulocytes: 0.16 10*3/uL — ABNORMAL HIGH (ref 0.00–0.07)
Basophils Absolute: 0 10*3/uL (ref 0.0–0.1)
Basophils Relative: 0 %
Eosinophils Absolute: 0 10*3/uL (ref 0.0–0.5)
Eosinophils Relative: 0 %
HCT: 39 % (ref 36.0–46.0)
Hemoglobin: 12.6 g/dL (ref 12.0–15.0)
Immature Granulocytes: 2 %
Lymphocytes Relative: 14 %
Lymphs Abs: 1.3 10*3/uL (ref 0.7–4.0)
MCH: 28.5 pg (ref 26.0–34.0)
MCHC: 32.3 g/dL (ref 30.0–36.0)
MCV: 88.2 fL (ref 80.0–100.0)
Monocytes Absolute: 0.5 10*3/uL (ref 0.1–1.0)
Monocytes Relative: 5 %
Neutro Abs: 7.4 10*3/uL (ref 1.7–7.7)
Neutrophils Relative %: 79 %
Platelets: 380 10*3/uL (ref 150–400)
RBC: 4.42 MIL/uL (ref 3.87–5.11)
RDW: 12.7 % (ref 11.5–15.5)
WBC: 9.4 10*3/uL (ref 4.0–10.5)
nRBC: 0 % (ref 0.0–0.2)

## 2019-02-12 LAB — D-DIMER, QUANTITATIVE: D-Dimer, Quant: 1.44 ug/mL-FEU — ABNORMAL HIGH (ref 0.00–0.50)

## 2019-02-12 LAB — C-REACTIVE PROTEIN: CRP: 8.1 mg/dL — ABNORMAL HIGH (ref <1.0)

## 2019-02-12 MED ORDER — METHYLPREDNISOLONE SODIUM SUCC 40 MG IJ SOLR
40.0000 mg | Freq: Two times a day (BID) | INTRAMUSCULAR | Status: DC
  Administered 2019-02-12 – 2019-02-13 (×2): 40 mg via INTRAVENOUS
  Filled 2019-02-12 (×2): qty 1

## 2019-02-12 NOTE — Progress Notes (Signed)
Inpatient Diabetes Program Recommendations  AACE/ADA: New Consensus Statement on Inpatient Glycemic Control (2015)  Target Ranges:  Prepandial:   less than 140 mg/dL      Peak postprandial:   less than 180 mg/dL (1-2 hours)      Critically ill patients:  140 - 180 mg/dL   Lab Results  Component Value Date   GLUCAP 235 (H) 02/12/2019   HGBA1C 6.5 (H) 02/11/2019    Review of Glycemic Control Results for DINITA, MIGLIACCIO (MRN [id number]) as of 02/12/2019 12:19  Ref. Range 02/11/2019 16:59 02/11/2019 20:24 02/12/2019 07:37 02/12/2019 11:20  Glucose-Capillary Latest Ref Range: 70 - 99 mg/dL 251 (H) 259 (H) 217 (H) 235 (H)   Diabetes history: new onset DM? Outpatient Diabetes medications: none Current orders for Inpatient glycemic control: Novolog 0-20 units TID Solumedrol 60 mg BID  Inpatient Diabetes Program Recommendations:    In the setting of steroids consider: - adding carb modified portion to diet.  -Adding Levemir 6 units BID  Noted A1C results, indicating Diabetes per ADA guidelines. As soon as notated in MD progress note, will plan to begin education.   Patient will need at meter at discharge. Glucose meter (includes lancets and strips) (#[id number]).  Thanks, Bronson Curb, MSN, RNC-OB Diabetes Coordinator [phone] (8a-5p)

## 2019-02-12 NOTE — Plan of Care (Signed)
  Problem: Education: Goal: Knowledge of risk factors and measures for prevention of condition will improve Outcome: Progressing   Problem: Coping: Goal: Psychosocial and spiritual needs will be supported Outcome: Progressing   Problem: Respiratory: Goal: Will maintain a patent airway Outcome: Progressing Goal: Complications related to the disease process, condition or treatment will be avoided or minimized Outcome: Progressing   

## 2019-02-12 NOTE — Progress Notes (Signed)
Patient transferred to room 309 via wheelchair, A/O x 4, VSS on RA, ambulatory.  Report given to Lavella Lemons, Therapist, sports.

## 2019-02-12 NOTE — Progress Notes (Signed)
Pt AAOx4. Pt stated that she would update husband about progression. Primary nurse answered all questions

## 2019-02-12 NOTE — Progress Notes (Addendum)
PROGRESS NOTE                                                                                                                                                                                                             Patient Demographics:    Cathy Rowland, is a 48 y.o. female, DOB - 15-May-1970, ZOX:[id number]  Outpatient Primary MD for the patient is Cathy Rowland, Kristen W., PA-C   Admit date - 02/09/2019   LOS - 3  Chief Complaint  Patient presents with  . Covid/SOB       Brief Narrative: Patient is a 48 y.o. 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   Subjective:    Cathy GrippeVickie Krock feels much better-she is now on room air.   Assessment  & Plan :   Acute Hypoxic Resp Failure due to Covid 19 Viral pneumonia: Difficulty better-titrated down to room air.  Continue steroids (but taper) and remdesivir-last dose on 12/19.  Patient is s/p Actemra on 12/16. Fever: afebrile  O2 requirements:  SpO2: 93 % O2 Flow Rate (L/min): 2 L/min   COVID-19 Labs: Recent Labs    02/09/19 1751 02/09/19 1830 02/10/19 0349 02/11/19 0104 02/12/19 0035  DDIMER 1.22*  --  1.32* 1.38* 1.44*  FERRITIN  --  526*  --   --   --   LDH 198*  --  225*  --   --   CRP  --  24.0* 24.6* 14.6* 8.1*       Component Value Date/Time   BNP 41.5 02/09/2019 1751    Recent Labs  Lab 02/09/19 1751  PROCALCITON <0.10    Lab Results  Component Value Date   SARSCOV2NAA POSITIVE (A) 02/09/2019     COVID-19 Medications: Steroids: 12/15>>  Remdesivir: 12/15>> Actemra: 12/16>> x1  Other medications: Diuretics:Euvolemic-hold Lasix-due to hypernatremia Antibiotics:Not needed as no evidence of bacterial infection Insulin: CBGs on the higher side-change to resistant  SSI-should improve as steroids being tapered down.  A1c 6.5.  Will require diet/exercise modification and recheck A1c in 3 months in the outpatient setting.  Prone/Incentive Spirometry: encouraged  incentive spirometry use 3-4/hour  DVT Prophylaxis  :  Lovenox  Hypertension: BP stable-continue amlodipine.    History of recurrent UTIs: Apparently was treated less than a week back-currently she has no symptoms of UTI.  Anxiety: Continue Xanax as needed  Chronic pain: Continue as needed Percocet  Obesity: Estimated body mass index is 43.94 kg/m as calculated from the following:   Height as of this encounter: 5\' 4"  (1.626 m).   Weight as of this encounter: 116.1 kg.   Consults  :  None  Procedures  :  None  ABG:    Component Value Date/Time   TCO2 26 03/06/2010 1031    Vent Settings: N/A  Condition -stable  Family Communication  : Unable to leave a voicemail-as a voicemail not set up-Again on 12/18 Code Status :  Full Code  Diet :  Diet Order            Diet Heart Room service appropriate? Yes; Fluid consistency: Thin  Diet effective now               Disposition Plan  :  Remain hospitalized hopefully home on 12/19.  Barriers to discharge: Hypoxia requiring O2 supplementation/complete 5 days of IV Remdesivir  Antimicorbials  :    Anti-infectives (From admission, onward)   Start     Dose/Rate Route Frequency Ordered Stop   02/10/19 1000  remdesivir 100 mg in sodium chloride 0.9 % 100 mL IVPB  Status:  Discontinued     100 mg 200 mL/hr over 30 Minutes Intravenous Daily 02/09/19 2109 02/09/19 2139   02/10/19 1000  remdesivir 100 mg in sodium chloride 0.9 % 100 mL IVPB     100 mg 200 mL/hr over 30 Minutes Intravenous Daily 02/10/19 0421 02/14/19 0959   02/10/19 0500  remdesivir 200 mg in sodium chloride 0.9% 250 mL IVPB     200 mg 580 mL/hr over 30 Minutes Intravenous Once 02/10/19 0421 02/10/19 0506   02/09/19 2115  remdesivir 200 mg in sodium chloride 0.9%  250 mL IVPB  Status:  Discontinued     200 mg 580 mL/hr over 30 Minutes Intravenous Once 02/09/19 2109 02/09/19 2139      Inpatient Medications  Scheduled Meds: . amLODipine  5 mg Oral Daily  . vitamin C  500 mg Oral Daily  . cholecalciferol  2,000 Units Oral Daily  . enoxaparin (LOVENOX) injection  60 mg Subcutaneous Q24H  . insulin aspart  0-20 Units Subcutaneous TID WC  . methylPREDNISolone (SOLU-MEDROL) injection  60 mg Intravenous Q12H  . zinc sulfate  220 mg Oral Daily   Continuous Infusions: . remdesivir 100 mg in NS 100 mL 100 mg (02/11/19 0900)   PRN Meds:.acetaminophen, albuterol, ALPRAZolam, guaiFENesin-dextromethorphan, oxyCODONE-acetaminophen **AND** oxyCODONE   Time Spent in minutes 25  See all Orders from today for further details   02/13/19 M.D on 02/12/2019 at 7:37 AM  To page go to [email or web address] - use universal password  [facility] -  Office  [phone]    Objective:   Vitals:   02/11/19 1117 02/11/19 1655 02/11/19 2003 02/12/19 0500  BP: 114/73 132/84 134/81 139/78  Pulse: 83 92 93 71  Resp: 20 18    Temp: 98 F (36.7 C) 98.2 F (36.8 C) 99 F (37.2 C) 97.8 F (36.6 C)  TempSrc: Oral Oral Oral Oral  SpO2: 94% 94% 94% 93%  Weight:      Height:        Wt Readings from Last 3 Encounters:  02/10/19 116.1 kg  01/03/18 121.1 kg  02/06/17  119.7 kg     Intake/Output Summary (Last 24 hours) at 02/12/2019 0737 Last data filed at 02/11/2019 0916 Gross per 24 hour  Intake 120 ml  Output -  Net 120 ml     Physical Exam Gen Exam:Alert awake-not in any distress HEENT:atraumatic, normocephalic Chest: B/L clear to auscultation anteriorly CVS:S1S2 regular Abdomen:soft non tender, non distended Extremities:no edema Neurology: Non focal Skin: no rash  Data Review:    CBC Recent Labs  Lab 02/09/19 1751 02/10/19 0349 02/11/19 0104 02/12/19 0035  WBC 5.9 5.5 5.3 9.4  HGB 12.4 12.9 12.6 12.6  HCT 38.4 40.6 38.3 39.0   PLT 216 232 297 380  MCV 89.7 89.4 87.6 88.2  MCH 29.0 28.4 28.8 28.5  MCHC 32.3 31.8 32.9 32.3  RDW 12.5 12.5 12.5 12.7  LYMPHSABS 0.9 0.9 1.1 1.3  MONOABS 0.3 0.3 0.4 0.5  EOSABS 0.0 0.0 0.0 0.0  BASOSABS 0.0 0.0 0.0 0.0    Chemistries  Recent Labs  Lab 02/09/19 1751 02/10/19 0349 02/11/19 0104 02/12/19 0035  NA 143 142 144 148*  K 4.7 4.5 3.9 3.6  CL 107 106 108 110  CO2 25 27 25 27   GLUCOSE 114* 170* 195* 189*  BUN 9 9 15  24*  CREATININE 0.93 0.86 0.75 0.85  CALCIUM 8.8* 9.1 9.1 9.2  AST 22 23 20 20   ALT 13 13 14 15   ALKPHOS 45 47 50 48  BILITOT 0.7 0.3 1.1 0.7   ------------------------------------------------------------------------------------------------------------------ Recent Labs    02/09/19 1751  TRIG 90    Lab Results  Component Value Date   HGBA1C 6.5 (H) 02/11/2019   ------------------------------------------------------------------------------------------------------------------ No results for input(s): TSH, T4TOTAL, T3FREE, THYROIDAB in the last 72 hours.  Invalid input(s): FREET3 ------------------------------------------------------------------------------------------------------------------ Recent Labs    02/09/19 1830  FERRITIN 526*    Coagulation profile No results for input(s): INR, PROTIME in the last 168 hours.  Recent Labs    02/11/19 0104 02/12/19 0035  DDIMER 1.38* 1.44*    Cardiac Enzymes No results for input(s): CKMB, TROPONINI, MYOGLOBIN in the last 168 hours.  Invalid input(s): CK ------------------------------------------------------------------------------------------------------------------    Component Value Date/Time   BNP 41.5 02/09/2019 1751    Micro Results Recent Results (from the past 240 hour(s))  Blood Culture (routine x 2)     Status: None (Preliminary result)   Collection Time: 02/09/19  5:51 PM   Specimen: BLOOD  Result Value Ref Range Status   Specimen Description BLOOD LEFT ANTECUBITAL   Final   Special Requests   Final    BOTTLES DRAWN AEROBIC AND ANAEROBIC Blood Culture results may not be optimal due to an inadequate volume of blood received in culture bottles   Culture   Final    NO GROWTH 2 DAYS Performed at [facility] [address] [place], [place] [address]    Report Status PENDING  Incomplete  Blood Culture (routine x 2)     Status: None (Preliminary result)   Collection Time: 02/09/19  5:56 PM   Specimen: BLOOD  Result Value Ref Range Status   Specimen Description BLOOD RIGHT ANTECUBITAL  Final   Special Requests   Final    BOTTLES DRAWN AEROBIC AND ANAEROBIC Blood Culture results may not be optimal due to an inadequate volume of blood received in culture bottles   Culture   Final    NO GROWTH 2 DAYS Performed at [facility], [place] [address], [place], [place] [address]    Report Status  PENDING  Incomplete  SARS CORONAVIRUS 2 (TAT 6-24 HRS) Nasopharyngeal Nasopharyngeal Swab     Status: Abnormal   Collection Time: 02/09/19  7:44 PM   Specimen: Nasopharyngeal Swab  Result Value Ref Range Status   SARS Coronavirus 2 POSITIVE (A) NEGATIVE Final    Comment: RESULT CALLED TO, READ BACK BY AND VERIFIED WITH: B SANGALANG,RN 0058 02/10/2019 D BRADLEY (NOTE) SARS-CoV-2 target nucleic acids are DETECTED. The SARS-CoV-2 RNA is generally detectable in upper and lower respiratory specimens during the acute phase of infection. Positive results are indicative of the presence of SARS-CoV-2 RNA. Clinical correlation with patient history and other diagnostic information is  necessary to determine patient infection status. Positive results do not rule out bacterial infection or co-infection with other viruses.  The expected result is Negative. Fact Sheet for Patients: HairSlick.no Fact Sheet for Healthcare Providers: quierodirigir.com This test is not yet approved or cleared by the [place] FDA and   has been authorized for detection and/or diagnosis of SARS-CoV-2 by FDA under an Emergency Use Authorization (EUA). This EUA will remain  in effect (meaning this test can be used) fo r the duration of the COVID-19 declaration under Section 564(b)(1) of the Act, 21 U.S.C. section 360bbb-3(b)(1), unless the authorization is terminated or revoked sooner. Performed at [facility], [address] [place], [place] [address]     Radiology Reports DG Chest [place] 1 View  Result Date: 02/09/2019 CLINICAL DATA:  Right flank pain for approximately 2 days. Hematuria. EXAM: PORTABLE CHEST 1 VIEW COMPARISON:  PA and lateral chest 04/16/2013. FINDINGS: There is hazy bilateral airspace disease. Heart size is upper normal. No pneumothorax or pleural fusion. IMPRESSION: Hazy bilateral airspace disease could be due to edema or pneumonia. Electronically Signed   By: Drusilla Kanner M.D.   On: 02/09/2019 18:42

## 2019-02-13 LAB — CBC WITH DIFFERENTIAL/PLATELET
Abs Immature Granulocytes: 0.22 10*3/uL — ABNORMAL HIGH (ref 0.00–0.07)
Basophils Absolute: 0 10*3/uL (ref 0.0–0.1)
Basophils Relative: 0 %
Eosinophils Absolute: 0 10*3/uL (ref 0.0–0.5)
Eosinophils Relative: 0 %
HCT: 39.5 % (ref 36.0–46.0)
Hemoglobin: 12.8 g/dL (ref 12.0–15.0)
Immature Granulocytes: 2 %
Lymphocytes Relative: 14 %
Lymphs Abs: 1.4 10*3/uL (ref 0.7–4.0)
MCH: 28.3 pg (ref 26.0–34.0)
MCHC: 32.4 g/dL (ref 30.0–36.0)
MCV: 87.4 fL (ref 80.0–100.0)
Monocytes Absolute: 0.5 10*3/uL (ref 0.1–1.0)
Monocytes Relative: 5 %
Neutro Abs: 7.7 10*3/uL (ref 1.7–7.7)
Neutrophils Relative %: 79 %
Platelets: 380 10*3/uL (ref 150–400)
RBC: 4.52 MIL/uL (ref 3.87–5.11)
RDW: 12.5 % (ref 11.5–15.5)
WBC: 9.8 10*3/uL (ref 4.0–10.5)
nRBC: 0 % (ref 0.0–0.2)

## 2019-02-13 LAB — COMPREHENSIVE METABOLIC PANEL
ALT: 33 U/L (ref 0–44)
AST: 41 U/L (ref 15–41)
Albumin: 3.1 g/dL — ABNORMAL LOW (ref 3.5–5.0)
Alkaline Phosphatase: 49 U/L (ref 38–126)
Anion gap: 11 (ref 5–15)
BUN: 32 mg/dL — ABNORMAL HIGH (ref 6–20)
CO2: 26 mmol/L (ref 22–32)
Calcium: 8.9 mg/dL (ref 8.9–10.3)
Chloride: 111 mmol/L (ref 98–111)
Creatinine, Ser: 0.96 mg/dL (ref 0.44–1.00)
GFR calc Af Amer: >60 mL/min (ref >60)
GFR calc non Af Amer: >60 mL/min (ref >60)
Glucose, Bld: 232 mg/dL — ABNORMAL HIGH (ref 70–99)
Potassium: 3.6 mmol/L (ref 3.5–5.1)
Sodium: 148 mmol/L — ABNORMAL HIGH (ref 135–145)
Total Bilirubin: 1 mg/dL (ref 0.3–1.2)
Total Protein: 6.5 g/dL (ref 6.5–8.1)

## 2019-02-13 LAB — C-REACTIVE PROTEIN: CRP: 4.1 mg/dL — ABNORMAL HIGH (ref <1.0)

## 2019-02-13 LAB — D-DIMER, QUANTITATIVE: D-Dimer, Quant: 1.37 ug/mL-FEU — ABNORMAL HIGH (ref 0.00–0.50)

## 2019-02-13 LAB — GLUCOSE, CAPILLARY
Glucose-Capillary: 191 mg/dL — ABNORMAL HIGH (ref 70–99)
Glucose-Capillary: 263 mg/dL — ABNORMAL HIGH (ref 70–99)

## 2019-02-13 MED ORDER — PREDNISONE 10 MG PO TABS: ORAL_TABLET | ORAL | 0 refills | Status: DC

## 2019-02-13 NOTE — Discharge Instructions (Signed)
Person Under Monitoring Name: Cathy Rowland  Location: [address] [place] [address]   Infection Prevention Recommendations for Individuals Confirmed to have, or Being Evaluated for, 2019 Novel Coronavirus (COVID-19) Infection Who Receive Care at Home  Individuals who are confirmed to have, or are being evaluated for, COVID-19 should follow the prevention steps below until a healthcare provider or local or state health department says they can return to normal activities.  Stay home except to get medical care You should restrict activities outside your home, except for getting medical care. Do not go to work, school, or public areas, and do not use public transportation or taxis.  Call ahead before visiting your doctor Before your medical appointment, call the healthcare provider and tell them that you have, or are being evaluated for, COVID-19 infection. This will help the healthcare provider's office take steps to keep other people from getting infected. Ask your healthcare provider to call the local or state health department.  Monitor your symptoms Seek prompt medical attention if your illness is worsening (e.g., difficulty breathing). Before going to your medical appointment, call the healthcare provider and tell them that you have, or are being evaluated for, COVID-19 infection. Ask your healthcare provider to call the local or state health department.  Wear a facemask You should wear a facemask that covers your nose and mouth when you are in the same room with other people and when you visit a healthcare provider. People who live with or visit you should also wear a facemask while they are in the same room with you.  Separate yourself from other people in your home As much as possible, you should stay in a different room from other people in your home. Also, you should use a separate bathroom, if available.  Avoid sharing household items You should not share  dishes, drinking glasses, cups, eating utensils, towels, bedding, or other items with other people in your home. After using these items, you should wash them thoroughly with soap and water.  Cover your coughs and sneezes Cover your mouth and nose with a tissue when you cough or sneeze, or you can cough or sneeze into your sleeve. Throw used tissues in a lined trash can, and immediately wash your hands with soap and water for at least 20 seconds or use an alcohol-based hand rub.  Wash your [facility] your hands often and thoroughly with soap and water for at least 20 seconds. You can use an alcohol-based hand sanitizer if soap and water are not available and if your hands are not visibly dirty. Avoid touching your eyes, nose, and mouth with unwashed hands.   Prevention Steps for Caregivers and Household Members of Individuals Confirmed to have, or Being Evaluated for, COVID-19 Infection Being Cared for in the Home  If you live with, or provide care at home for, a person confirmed to have, or being evaluated for, COVID-19 infection please follow these guidelines to prevent infection:  Follow healthcare provider's instructions Make sure that you understand and can help the patient follow any healthcare provider instructions for all care.  Provide for the patient's basic needs You should help the patient with basic needs in the home and provide support for getting groceries, prescriptions, and other personal needs.  Monitor the patient's symptoms If they are getting sicker, call his or her medical provider and tell them that the patient has, or is being evaluated for, COVID-19 infection. This will help the healthcare provider's office  take steps to keep other people from getting infected. Ask the healthcare provider to call the local or state health department.  Limit the number of people who have contact with the patient  If possible, have only one caregiver for the patient.  Other  household members should stay in another home or place of residence. If this is not possible, they should stay  in another room, or be separated from the patient as much as possible. Use a separate bathroom, if available.  Restrict visitors who do not have an essential need to be in the home.  Keep older adults, very young children, and other sick people away from the patient Keep older adults, very young children, and those who have compromised immune systems or chronic health conditions away from the patient. This includes people with chronic heart, lung, or kidney conditions, diabetes, and cancer.  Ensure good ventilation Make sure that shared spaces in the home have good air flow, such as from an air conditioner or an opened window, weather permitting.  Wash your hands often  Wash your hands often and thoroughly with soap and water for at least 20 seconds. You can use an alcohol based hand sanitizer if soap and water are not available and if your hands are not visibly dirty.  Avoid touching your eyes, nose, and mouth with unwashed hands.  Use disposable paper towels to dry your hands. If not available, use dedicated cloth towels and replace them when they become wet.  Wear a facemask and gloves  Wear a disposable facemask at all times in the room and gloves when you touch or have contact with the patient's blood, body fluids, and/or secretions or excretions, such as sweat, saliva, sputum, nasal mucus, vomit, urine, or feces.  Ensure the mask fits over your nose and mouth tightly, and do not touch it during use.  Throw out disposable facemasks and gloves after using them. Do not reuse.  Wash your hands immediately after removing your facemask and gloves.  If your personal clothing becomes contaminated, carefully remove clothing and launder. Wash your hands after handling contaminated clothing.  Place all used disposable facemasks, gloves, and other waste in a lined container before  disposing them with other household waste.  Remove gloves and wash your hands immediately after handling these items.  Do not share dishes, glasses, or other household items with the patient  Avoid sharing household items. You should not share dishes, drinking glasses, cups, eating utensils, towels, bedding, or other items with a patient who is confirmed to have, or being evaluated for, COVID-19 infection.  After the person uses these items, you should wash them thoroughly with soap and water.  Wash laundry thoroughly  Immediately remove and wash clothes or bedding that have blood, body fluids, and/or secretions or excretions, such as sweat, saliva, sputum, nasal mucus, vomit, urine, or feces, on them.  Wear gloves when handling laundry from the patient.  Read and follow directions on labels of laundry or clothing items and detergent. In general, wash and dry with the warmest temperatures recommended on the label.  Clean all areas the individual has used often  Clean all touchable surfaces, such as counters, tabletops, doorknobs, bathroom fixtures, toilets, phones, keyboards, tablets, and bedside tables, every day. Also, clean any surfaces that may have blood, body fluids, and/or secretions or excretions on them.  Wear gloves when cleaning surfaces the patient has come in contact with.  Use a diluted bleach solution (e.g., dilute bleach with 1 part  bleach and 10 parts water) or a household disinfectant with a label that says EPA-registered for coronaviruses. To make a bleach solution at home, add 1 tablespoon of bleach to 1 quart (4 cups) of water. For a larger supply, add  cup of bleach to 1 gallon (16 cups) of water.  Read labels of cleaning products and follow recommendations provided on product labels. Labels contain instructions for safe and effective use of the cleaning product including precautions you should take when applying the product, such as wearing gloves or eye protection  and making sure you have good ventilation during use of the product.  Remove gloves and wash hands immediately after cleaning.  Monitor yourself for signs and symptoms of illness Caregivers and household members are considered close contacts, should monitor their health, and will be asked to limit movement outside of the home to the extent possible. Follow the monitoring steps for close contacts listed on the symptom monitoring form.   ? If you have additional questions, contact your local health department or call the epidemiologist on call at [phone] (available 24/7). ? This guidance is subject to change. For the most up-to-date guidance from [facility], please refer to their website: YouBlogs.pl

## 2019-02-13 NOTE — Discharge Summary (Signed)
PATIENT DETAILS Name: Cathy Rowland Age: 48 y.o. Sex: female Date of Birth: [birth year] MRN: [id number]. Admitting Physician: Shela Leff, MD WUJ:[id number], Baldemar Friday., PA-C  Admit Date: 02/09/2019 Discharge date: 02/13/2019  Recommendations for Outpatient Follow-up:  1. Follow up with PCP in 1-2 weeks 2. Please obtain CMP/CBC in one week 3. Repeat Chest Xray in 4-6 week  Admitted From:  Home   Disposition: [place]: No  Equipment/Devices: None  Discharge Condition: Stable  CODE STATUS: FULL CODE  Diet recommendation:  Diet Order            Diet - low sodium heart healthy        Diet Heart Room service appropriate? Yes; Fluid consistency: Thin  Diet effective now               Brief Summary: See H&P, Labs, Consult and Test reports for all details in brief, Patient is a 48 y.o. 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  Brief Hospital Course: Acute Hypoxic Resp Failure due to Covid 19 Viral pneumonia:  Markedly better-on room air. Ambulated with nursing staff-does not require home O2. Treated with steroids, remdesivir and 1 dose of Actemra. Stable for discharge with close follow-up with PCP.  COVID-19 Labs:  Recent Labs    02/11/19 0104 02/12/19 0035 02/13/19 0528  DDIMER 1.38* 1.44* 1.37*  CRP 14.6* 8.1* 4.1*    Lab Results  Component Value Date   SARSCOV2NAA POSITIVE (A) 02/09/2019     COVID-19 Medications: Steroids: 12/15>>        Remdesivir: 12/15>>12/19 Actemra: 12/16>> x1  Hypertension: BP stable-continue amlodipine.    DM-2 (A1c 6.5): Recommended dietary/lifestyle modification to the patient-repeat A1c in the next few months.  History of recurrent UTIs: Apparently was treated less than a week back-currently she has no symptoms of  UTI.  Anxiety: Continue Xanax as needed  Chronic pain: Continue as needed Percocet  Obesity: Estimated body mass index is 43.94 kg/m as calculated from the following:   Height as of this encounter: 5\' 4"  (1.626 m).   Weight as of this encounter: 116.1 kg.     Procedures/Studies: None  Discharge Diagnoses:  Principal Problem:   Acute respiratory failure with hypoxia (HCC) Active Problems:   Suspected COVID-19 virus infection   Pneumonia   Anxiety   History of recurrent UTI (urinary tract infection)   Pneumonia due to COVID-19 virus   Discharge Instructions:    Person Under Monitoring Name: Cathy Rowland  Location: [address] [place] [address]   Infection Prevention Recommendations for Individuals Confirmed to have, or Being Evaluated for, 2019 Novel Coronavirus (COVID-19) Infection Who Receive Care at Home  Individuals who are confirmed to have, or are being evaluated for, COVID-19 should follow the prevention steps below until a healthcare provider or local or state health department says they can return to normal activities.  Stay home except to get medical care You should restrict activities outside your home, except for getting medical care. Do not go to work, school, or public areas, and do not use public transportation or taxis.  Call ahead before visiting your doctor Before your medical appointment, call the healthcare provider and tell them that you have, or are being evaluated for, COVID-19 infection. This will help the healthcare provider's  office take steps to keep other people from getting infected. Ask your healthcare provider to call the local or state health department.  Monitor your symptoms Seek prompt medical attention if your illness is worsening (e.g., difficulty breathing). Before going to your medical appointment, call the healthcare provider and tell them that you have, or are being evaluated for, COVID-19 infection. Ask your  healthcare provider to call the local or state health department.  Wear a facemask You should wear a facemask that covers your nose and mouth when you are in the same room with other people and when you visit a healthcare provider. People who live with or visit you should also wear a facemask while they are in the same room with you.  Separate yourself from other people in your home As much as possible, you should stay in a different room from other people in your home. Also, you should use a separate bathroom, if available.  Avoid sharing household items You should not share dishes, drinking glasses, cups, eating utensils, towels, bedding, or other items with other people in your home. After using these items, you should wash them thoroughly with soap and water.  Cover your coughs and sneezes Cover your mouth and nose with a tissue when you cough or sneeze, or you can cough or sneeze into your sleeve. Throw used tissues in a lined trash can, and immediately wash your hands with soap and water for at least 20 seconds or use an alcohol-based hand rub.  Wash your [employer]hands Wash your hands often and thoroughly with soap and water for at least 20 seconds. You can use an alcohol-based hand sanitizer if soap and water are not available and if your hands are not visibly dirty. Avoid touching your eyes, nose, and mouth with unwashed hands.   Prevention Steps for Caregivers and Household Members of Individuals Confirmed to have, or Being Evaluated for, COVID-19 Infection Being Cared for in the Home  If you live with, or provide care at home for, a person confirmed to have, or being evaluated for, COVID-19 infection please follow these guidelines to prevent infection:  Follow healthcare provider's instructions Make sure that you understand and can help the patient follow any healthcare provider instructions for all care.  Provide for the patient's basic needs You should help the patient with  basic needs in the home and provide support for getting groceries, prescriptions, and other personal needs.  Monitor the patient's symptoms If they are getting sicker, call his or her medical provider and tell them that the patient has, or is being evaluated for, COVID-19 infection. This will help the healthcare provider's office take steps to keep other people from getting infected. Ask the healthcare provider to call the local or state health department.  Limit the number of people who have contact with the patient  If possible, have only one caregiver for the patient.  Other household members should stay in another home or place of residence. If this is not possible, they should stay  in another room, or be separated from the patient as much as possible. Use a separate bathroom, if available.  Restrict visitors who do not have an essential need to be in the home.  Keep older adults, very young children, and other sick people away from the patient Keep older adults, very young children, and those who have compromised immune systems or chronic health conditions away from the patient. This includes people with chronic heart, lung, or kidney conditions, diabetes, and  cancer.  Ensure good ventilation Make sure that shared spaces in the home have good air flow, such as from an air conditioner or an opened window, weather permitting.  Wash your hands often  Wash your hands often and thoroughly with soap and water for at least 20 seconds. You can use an alcohol based hand sanitizer if soap and water are not available and if your hands are not visibly dirty.  Avoid touching your eyes, nose, and mouth with unwashed hands.  Use disposable paper towels to dry your hands. If not available, use dedicated cloth towels and replace them when they become wet.  Wear a facemask and gloves  Wear a disposable facemask at all times in the room and gloves when you touch or have contact with the  patient's blood, body fluids, and/or secretions or excretions, such as sweat, saliva, sputum, nasal mucus, vomit, urine, or feces.  Ensure the mask fits over your nose and mouth tightly, and do not touch it during use.  Throw out disposable facemasks and gloves after using them. Do not reuse.  Wash your hands immediately after removing your facemask and gloves.  If your personal clothing becomes contaminated, carefully remove clothing and launder. Wash your hands after handling contaminated clothing.  Place all used disposable facemasks, gloves, and other waste in a lined container before disposing them with other household waste.  Remove gloves and wash your hands immediately after handling these items.  Do not share dishes, glasses, or other household items with the patient  Avoid sharing household items. You should not share dishes, drinking glasses, cups, eating utensils, towels, bedding, or other items with a patient who is confirmed to have, or being evaluated for, COVID-19 infection.  After the person uses these items, you should wash them thoroughly with soap and water.  Wash laundry thoroughly  Immediately remove and wash clothes or bedding that have blood, body fluids, and/or secretions or excretions, such as sweat, saliva, sputum, nasal mucus, vomit, urine, or feces, on them.  Wear gloves when handling laundry from the patient.  Read and follow directions on labels of laundry or clothing items and detergent. In general, wash and dry with the warmest temperatures recommended on the label.  Clean all areas the individual has used often  Clean all touchable surfaces, such as counters, tabletops, doorknobs, bathroom fixtures, toilets, phones, keyboards, tablets, and bedside tables, every day. Also, clean any surfaces that may have blood, body fluids, and/or secretions or excretions on them.  Wear gloves when cleaning surfaces the patient has come in contact with.  Use a diluted  bleach solution (e.g., dilute bleach with 1 part bleach and 10 parts water) or a household disinfectant with a label that says EPA-registered for coronaviruses. To make a bleach solution at home, add 1 tablespoon of bleach to 1 quart (4 cups) of water. For a larger supply, add  cup of bleach to 1 gallon (16 cups) of water.  Read labels of cleaning products and follow recommendations provided on product labels. Labels contain instructions for safe and effective use of the cleaning product including precautions you should take when applying the product, such as wearing gloves or eye protection and making sure you have good ventilation during use of the product.  Remove gloves and wash hands immediately after cleaning.  Monitor yourself for signs and symptoms of illness Caregivers and household members are considered close contacts, should monitor their health, and will be asked to limit movement outside of the home to  the extent possible. Follow the monitoring steps for close contacts listed on the symptom monitoring form.   ? If you have additional questions, contact your local health department or call the epidemiologist on call at [phone] (available 24/7). ? This guidance is subject to change. For the most up-to-date guidance from CDC, please refer to their website: TripMetro.hu    Activity:  As tolerated   Discharge Instructions    Call MD for:  difficulty breathing, headache or visual disturbances   Complete by: As directed    Call MD for:  extreme fatigue   Complete by: As directed    Call MD for:  persistant dizziness or light-headedness   Complete by: As directed    Call MD for:  persistant nausea and vomiting   Complete by: As directed    Diet - low sodium heart healthy   Complete by: As directed    Discharge instructions   Complete by: As directed    Follow with Primary MD  Richmond Campbell., PA-C in 1-2  weeks  Your hemoglobin A1c is slightly elevated-you might have early diabetes-please stay on a diet/exercise regimen for the next 3 months-have your primary care practitioner repeat a hemoglobin A1c.  Please get a complete blood count and chemistry panel checked by your Primary MD at your next visit, and again as instructed by your Primary MD.  Get Medicines reviewed and adjusted: Please take all your medications with you for your next visit with your Primary MD  Laboratory/radiological data: Please request your Primary MD to go over all hospital tests and procedure/radiological results at the follow up, please ask your Primary MD to get all Hospital records sent to his/her office.  In some cases, they will be blood work, cultures and biopsy results pending at the time of your discharge. Please request that your primary care M.D. follows up on these results.  Also Note the following: If you experience worsening of your admission symptoms, develop shortness of breath, life threatening emergency, suicidal or homicidal thoughts you must seek medical attention immediately by calling 911 or calling your MD immediately  if symptoms less severe.  You must read complete instructions/literature along with all the possible adverse reactions/side effects for all the Medicines you take and that have been prescribed to you. Take any new Medicines after you have completely understood and accpet all the possible adverse reactions/side effects.   Do not drive when taking Pain medications or sleeping medications (Benzodaizepines)  Do not take more than prescribed Pain, Sleep and Anxiety Medications. It is not advisable to combine anxiety,sleep and pain medications without talking with your primary care practitioner  Special Instructions: If you have smoked or chewed Tobacco  in the last 2 yrs please stop smoking, stop any regular Alcohol  and or any Recreational drug use.  Wear Seat belts while  driving.  Please note: You were cared for by a hospitalist during your hospital stay. Once you are discharged, your primary care physician will handle any further medical issues. Please note that NO REFILLS for any discharge medications will be authorized once you are discharged, as it is imperative that you return to your primary care physician (or establish a relationship with a primary care physician if you do not have one) for your post hospital discharge needs so that they can reassess your need for medications and monitor your lab values.   3 weeks of isolation from 02/09/2019   Increase activity slowly   Complete by: As directed  Allergies as of 02/13/2019      Reactions   Aspirin Other (See Comments)   Upsets ulcer   Cyclobenzaprine Other (See Comments)   Unknown   Hydrocodone Other (See Comments)   Fidgety and jittery   Mesalamine Other (See Comments)   Upsets ulcer   Septra [sulfamethoxazole-trimethoprim] Nausea And Vomiting   Latex Rash   Unknown      Medication List    TAKE these medications   ALPRAZolam 1 MG tablet Commonly known as: XANAX Take 1 mg by mouth at bedtime as needed for anxiety or sleep.   amLODipine 5 MG tablet Commonly known as: NORVASC Take 5 mg by mouth daily.   calcium-vitamin D 500-200 MG-UNIT tablet Commonly known as: OSCAL WITH D Take 1 tablet by mouth daily with breakfast.   ciprofloxacin 500 MG tablet Commonly known as: CIPRO Take 500 mg by mouth 2 (two) times daily.   nitrofurantoin 50 MG capsule Commonly known as: MACRODANTIN Take 50 mg by mouth daily.   oxyCODONE-acetaminophen 10-325 MG tablet Commonly known as: PERCOCET Take 1 tablet by mouth every 6 (six) hours as needed for pain.   phenazopyridine 200 MG tablet Commonly known as: PYRIDIUM Take 1 tablet (200 mg total) by mouth 3 (three) times daily with meals.   predniSONE 10 MG tablet Commonly known as: DELTASONE Take 40 mg daily for 1 day, 30 mg daily for 1 day,  20 mg daily for 1 days,10 mg daily for 1 day, then stop   ProAir HFA 108 (90 Base) MCG/ACT inhaler Generic drug: albuterol Inhale 2 puffs into the lungs every 4 (four) hours as needed for wheezing or shortness of breath.   zinc gluconate 50 MG tablet Take 50 mg by mouth daily.       Allergies  Allergen Reactions  . Aspirin Other (See Comments)    Upsets ulcer  . Cyclobenzaprine Other (See Comments)    Unknown  . Hydrocodone Other (See Comments)    Fidgety and jittery  . Mesalamine Other (See Comments)    Upsets ulcer  . Septra [Sulfamethoxazole-Trimethoprim] Nausea And Vomiting  . Latex Rash    Unknown    Consultations:   None   Other Procedures/Studies: DG Chest Port 1 View  Result Date: 02/09/2019 CLINICAL DATA:  Right flank pain for approximately 2 days. Hematuria. EXAM: PORTABLE CHEST 1 VIEW COMPARISON:  PA and lateral chest 04/16/2013. FINDINGS: There is hazy bilateral airspace disease. Heart size is upper normal. No pneumothorax or pleural fusion. IMPRESSION: Hazy bilateral airspace disease could be due to edema or pneumonia. Electronically Signed   By: Drusilla Kanner M.D.   On: 02/09/2019 18:42     TODAY-DAY OF DISCHARGE:  Subjective:   Cathy Rowland today has no headache,no chest abdominal pain,no new weakness tingling or numbness, feels much better wants to go home today.   Objective:   Blood pressure 130/78, pulse 67, temperature 98.6 F (37 C), temperature source Oral, resp. rate 18, height  (1.626 m), weight 116.1 kg, SpO2 96 %.  Intake/Output Summary (Last 24 hours) at 02/13/2019 1257 Last data filed at 02/13/2019 1100 Gross per 24 hour  Intake 960 ml  Output --  Net 960 ml   Filed Weights   02/10/19 1138 02/10/19 1149  Weight: 116.1 kg 116.1 kg    Exam: Awake Alert, Oriented *3, No new F.N deficits, Normal affect [place].AT,PERRAL Supple Neck,No JVD, No cervical lymphadenopathy appriciated.  Symmetrical Chest wall movement, Good air  movement bilaterally, CTAB RRR,No Gallops,Rubs  or new Murmurs, No Parasternal Heave +ve B.Sounds, Abd Soft, Non tender, No organomegaly appriciated, No rebound -guarding or rigidity. No Cyanosis, Clubbing or edema, No new Rash or bruise   PERTINENT RADIOLOGIC STUDIES: DG Chest Port 1 View  Result Date: 02/09/2019 CLINICAL DATA:  Right flank pain for approximately 2 days. Hematuria. EXAM: PORTABLE CHEST 1 VIEW COMPARISON:  PA and lateral chest 04/16/2013. FINDINGS: There is hazy bilateral airspace disease. Heart size is upper normal. No pneumothorax or pleural fusion. IMPRESSION: Hazy bilateral airspace disease could be due to edema or pneumonia. Electronically Signed   By: Drusilla Kanner M.D.   On: 02/09/2019 18:42     PERTINENT LAB RESULTS: CBC: Recent Labs    02/12/19 0035 02/13/19 0528  WBC 9.4 9.8  HGB 12.6 12.8  HCT 39.0 39.5  PLT 380 380   CMET CMP     Component Value Date/Time   NA 148 (H) 02/13/2019 0528   K 3.6 02/13/2019 0528   CL 111 02/13/2019 0528   CO2 26 02/13/2019 0528   GLUCOSE 232 (H) 02/13/2019 0528   BUN 32 (H) 02/13/2019 0528   CREATININE 0.96 02/13/2019 0528   CALCIUM 8.9 02/13/2019 0528   PROT 6.5 02/13/2019 0528   ALBUMIN 3.1 (L) 02/13/2019 0528   AST 41 02/13/2019 0528   ALT 33 02/13/2019 0528   ALKPHOS 49 02/13/2019 0528   BILITOT 1.0 02/13/2019 0528   GFRNONAA >60 02/13/2019 0528   GFRAA >60 02/13/2019 0528    GFR Estimated Creatinine Clearance: 87.8 mL/min (by C-G formula based on SCr of 0.96 mg/dL). No results for input(s): LIPASE, AMYLASE in the last 72 hours. No results for input(s): CKTOTAL, CKMB, CKMBINDEX, TROPONINI in the last 72 hours. Invalid input(s): POCBNP Recent Labs    02/12/19 0035 02/13/19 0528  DDIMER 1.44* 1.37*   Recent Labs    02/11/19 0104  HGBA1C 6.5*   No results for input(s): CHOL, HDL, LDLCALC, TRIG, CHOLHDL, LDLDIRECT in the last 72 hours. No results for input(s): TSH, T4TOTAL, T3FREE, THYROIDAB in  the last 72 hours.  Invalid input(s): FREET3 No results for input(s): VITAMINB12, FOLATE, FERRITIN, TIBC, IRON, RETICCTPCT in the last 72 hours. Coags: No results for input(s): INR in the last 72 hours.  Invalid input(s): PT Microbiology: Recent Results (from the past 240 hour(s))  Blood Culture (routine x 2)     Status: None (Preliminary result)   Collection Time: 02/09/19  5:51 PM   Specimen: BLOOD  Result Value Ref Range Status   Specimen Description BLOOD LEFT ANTECUBITAL  Final   Special Requests   Final    BOTTLES DRAWN AEROBIC AND ANAEROBIC Blood Culture results may not be optimal due to an inadequate volume of blood received in culture bottles   Culture   Final    NO GROWTH 3 DAYS Performed at [facility], [address], [place], [place] [address]    Report Status PENDING  Incomplete  Blood Culture (routine x 2)     Status: None (Preliminary result)   Collection Time: 02/09/19  5:56 PM   Specimen: BLOOD  Result Value Ref Range Status   Specimen Description BLOOD RIGHT ANTECUBITAL  Final   Special Requests   Final    BOTTLES DRAWN AEROBIC AND ANAEROBIC Blood Culture results may not be optimal due to an inadequate volume of blood received in culture bottles   Culture   Final    NO GROWTH 3 DAYS Performed at [facility], [address] [place], [place] [address]    Report Status PENDING  Incomplete  SARS CORONAVIRUS 2 (TAT 6-24 HRS) Nasopharyngeal Nasopharyngeal Swab     Status: Abnormal   Collection Time: 02/09/19  7:44 PM   Specimen: Nasopharyngeal Swab  Result Value Ref Range Status   SARS Coronavirus 2 POSITIVE (A) NEGATIVE Final    Comment: RESULT CALLED TO, READ BACK BY AND VERIFIED WITH: B SANGALANG,RN 0058 02/10/2019 D BRADLEY (NOTE) SARS-CoV-2 target nucleic acids are DETECTED. The SARS-CoV-2 RNA is generally detectable in upper and lower respiratory specimens during the acute phase of infection. Positive results are indicative of the  presence of SARS-CoV-2 RNA. Clinical correlation with patient history and other diagnostic information is  necessary to determine patient infection status. Positive results do not rule out bacterial infection or co-infection with other viruses.  The expected result is Negative. Fact Sheet for Patients: HairSlick.no Fact Sheet for Healthcare Providers: quierodirigir.com This test is not yet approved or cleared by the [place] FDA and  has been authorized for detection and/or diagnosis of SARS-CoV-2 by FDA under an Emergency Use Authorization (EUA). This EUA will remain  in effect (meaning this test can be used) fo r the duration of the COVID-19 declaration under Section 564(b)(1) of the Act, 21 U.S.C. section 360bbb-3(b)(1), unless the authorization is terminated or revoked sooner. Performed at [facility], [address] [place], [place] [address]     FURTHER DISCHARGE INSTRUCTIONS:  Get Medicines reviewed and adjusted: Please take all your medications with you for your next visit with your Primary MD  Laboratory/radiological data: Please request your Primary MD to go over all hospital tests and procedure/radiological results at the follow up, please ask your Primary MD to get all Hospital records sent to his/her office.  In some cases, they will be blood work, cultures and biopsy results pending at the time of your discharge. Please request that your primary care M.D. goes through all the records of your hospital data and follows up on these results.  Also Note the following: If you experience worsening of your admission symptoms, develop shortness of breath, life threatening emergency, suicidal or homicidal thoughts you must seek medical attention immediately by calling 911 or calling your MD immediately  if symptoms less severe.  You must read complete instructions/literature along with all the possible adverse  reactions/side effects for all the Medicines you take and that have been prescribed to you. Take any new Medicines after you have completely understood and accpet all the possible adverse reactions/side effects.   Do not drive when taking Pain medications or sleeping medications (Benzodaizepines)  Do not take more than prescribed Pain, Sleep and Anxiety Medications. It is not advisable to combine anxiety,sleep and pain medications without talking with your primary care practitioner  Special Instructions: If you have smoked or chewed Tobacco  in the last 2 yrs please stop smoking, stop any regular Alcohol  and or any Recreational drug use.  Wear Seat belts while driving.  Please note: You were cared for by a hospitalist during your hospital stay. Once you are discharged, your primary care physician will handle any further medical issues. Please note that NO REFILLS for any discharge medications will be authorized once you are discharged, as it is imperative that you return to your primary care physician (or establish a relationship with a primary care physician if you do not have one) for your post hospital discharge needs so that they can reassess your need for medications  and monitor your lab values.  Total Time spent coordinating discharge including counseling, education and face to face time equals 35 minutes.  SignedJeoffrey Massed 02/13/2019 12:57 PM

## 2019-02-13 NOTE — Progress Notes (Signed)
1530 Discharge orders given  Pt to follow up with PCP and use precautions with family.  Pt verbalized understanding IV removed cannula intact

## 2019-02-13 NOTE — Progress Notes (Signed)
Cathy Rowland ambulated in hallway without difficulty  Sats upon returning to rm 94%  Pt on room air last night and today  No distress noted

## 2019-02-14 LAB — CULTURE, BLOOD (ROUTINE X 2)
Culture: NO GROWTH
Culture: NO GROWTH

## 2019-06-02 ENCOUNTER — Other Ambulatory Visit: Payer: Self-pay | Admitting: Family Medicine

## 2019-06-02 DIAGNOSIS — Z1231 Encounter for screening mammogram for malignant neoplasm of breast: Secondary | ICD-10-CM

## 2019-06-15 ENCOUNTER — Ambulatory Visit: Payer: 59

## 2019-09-14 NOTE — Progress Notes (Deleted)
Patient referred by Aletha Halim., PA-C for ***  Subjective:   Cathy Rowland, female    DOB: 03/19/1970, 49 y.o.   MRN: [id number]  *** No chief complaint on file.   *** HPI  49 y.o. *** female with ***  *** Past Medical History:  Diagnosis Date  . Anxiety   . Arthritis   . Bladder infection   . Bulging discs   . Chronic UTI   . Female bladder prolapse   . Hypertension   . Menorrhagia   . Pyelonephritis   . Uterine polyp   . Yeast infection of the vagina     *** Past Surgical History:  Procedure Laterality Date  . BLADDER SURGERY    . CARPAL TUNNEL RELEASE    . CERVICAL POLYPECTOMY    . CHOLECYSTECTOMY    . GYNECOLOGIC CRYOSURGERY    . WRIST SURGERY     Carpal Tunnel surgery    *** Social History   Tobacco Use  Smoking Status Never Smoker  Smokeless Tobacco Current User  . Types: Snuff  Tobacco Comment   1 box every 2-3 days.    Social History   Substance and Sexual Activity  Alcohol Use No    *** Family History  Problem Relation Age of Onset  . Diabetes Father   . Hypertension Father   . Heart attack Neg Hx   . Hyperlipidemia Neg Hx     *** Current Outpatient Medications on File Prior to Visit  Medication Sig Dispense Refill  . ALPRAZolam (XANAX) 1 MG tablet Take 1 mg by mouth at bedtime as needed for anxiety or sleep.    Marland Kitchen amLODipine (NORVASC) 5 MG tablet Take 5 mg by mouth daily.    . calcium-vitamin D (OSCAL WITH D) 500-200 MG-UNIT tablet Take 1 tablet by mouth daily with breakfast.    . ciprofloxacin (CIPRO) 500 MG tablet Take 500 mg by mouth 2 (two) times daily.    . nitrofurantoin (MACRODANTIN) 50 MG capsule Take 50 mg by mouth daily.    Marland Kitchen oxyCODONE-acetaminophen (PERCOCET) 10-325 MG per tablet Take 1 tablet by mouth every 6 (six) hours as needed for pain.    . phenazopyridine (PYRIDIUM) 200 MG tablet Take 1 tablet (200 mg total) by mouth 3 (three) times daily with meals. 6 tablet 0  . predniSONE (DELTASONE) 10 MG tablet  Take 40 mg daily for 1 day, 30 mg daily for 1 day, 20 mg daily for 1 days,10 mg daily for 1 day, then stop 10 tablet 0  . PROAIR HFA 108 (90 Base) MCG/ACT inhaler Inhale 2 puffs into the lungs every 4 (four) hours as needed for wheezing or shortness of breath.   11  . zinc gluconate 50 MG tablet Take 50 mg by mouth daily.     No current facility-administered medications on file prior to visit.    Cardiovascular and other pertinent studies:  *** EKG ***/***/202***: *** EKG 08/08/2019: 1. Normal sinus rhythm                               2. Cannot rule out Anterior infarct , age undetermined                              3. Abnormal ECG     *** Recent labs: 06/02/2019-08/08/2019: Glucose 155, BUN/Cr 11/1.07. EGFR 69. Na/K 147/4.0. ***  CL:109 H/H 128/40.1. MCV 91. Platelets 205 ***HbA1C 4.8% Chol 163, TG 203, HDL 39, LDL 101 ***TSH 1.692 normal   *** ROS      *** There were no vitals filed for this visit.   There is no height or weight on file to calculate BMI. There were no vitals filed for this visit.  *** Objective:   Physical Exam    ***     Assessment & Recommendations:   ***  ***     Thank you for referring the patient to [place]. Please feel free to contact with any questions.  Nigel Mormon, MD [facility]. [facility] Pager: [phone] Office: [phone]

## 2019-09-15 ENCOUNTER — Ambulatory Visit: Payer: Self-pay | Admitting: Cardiology

## 2020-02-21 ENCOUNTER — Encounter: Payer: Self-pay | Admitting: Family Medicine

## 2020-02-22 ENCOUNTER — Encounter: Payer: Self-pay | Admitting: Internal Medicine

## 2020-02-22 ENCOUNTER — Telehealth: Payer: Self-pay

## 2020-02-22 ENCOUNTER — Other Ambulatory Visit (HOSPITAL_COMMUNITY)
Admission: RE | Admit: 2020-02-22 | Discharge: 2020-02-22 | Disposition: A | Discharge status: Home / Self Care | Payer: PRIVATE HEALTH INSURANCE | Source: Ambulatory Visit | Attending: Internal Medicine | Admitting: Internal Medicine

## 2020-02-22 ENCOUNTER — Other Ambulatory Visit: Payer: Self-pay

## 2020-02-22 ENCOUNTER — Ambulatory Visit: Payer: PRIVATE HEALTH INSURANCE | Admitting: Internal Medicine

## 2020-02-22 ENCOUNTER — Telehealth: Payer: Self-pay | Admitting: Internal Medicine

## 2020-02-22 VITALS — BP 128/78 | HR 84 | Ht 64.0 in | Wt 257.0 lb

## 2020-02-22 DIAGNOSIS — R079 Chest pain, unspecified: Secondary | ICD-10-CM | POA: Insufficient documentation

## 2020-02-22 DIAGNOSIS — R062 Wheezing: Secondary | ICD-10-CM | POA: Insufficient documentation

## 2020-02-22 DIAGNOSIS — R0602 Shortness of breath: Secondary | ICD-10-CM

## 2020-02-22 DIAGNOSIS — Z8616 Personal history of COVID-19: Secondary | ICD-10-CM | POA: Diagnosis not present

## 2020-02-22 LAB — CBC WITH DIFFERENTIAL/PLATELET
Abs Immature Granulocytes: 0.02 10*3/uL (ref 0.00–0.07)
Basophils Absolute: 0.1 10*3/uL (ref 0.0–0.1)
Basophils Relative: 1 %
Eosinophils Absolute: 0.2 10*3/uL (ref 0.0–0.5)
Eosinophils Relative: 2 %
HCT: 44.9 % (ref 36.0–46.0)
Hemoglobin: 14 g/dL (ref 12.0–15.0)
Immature Granulocytes: 0 %
Lymphocytes Relative: 35 %
Lymphs Abs: 3.1 10*3/uL (ref 0.7–4.0)
MCH: 29.6 pg (ref 26.0–34.0)
MCHC: 31.2 g/dL (ref 30.0–36.0)
MCV: 94.9 fL (ref 80.0–100.0)
Monocytes Absolute: 0.6 10*3/uL (ref 0.1–1.0)
Monocytes Relative: 7 %
Neutro Abs: 4.7 10*3/uL (ref 1.7–7.7)
Neutrophils Relative %: 55 %
Platelets: 265 10*3/uL (ref 150–400)
RBC: 4.73 MIL/uL (ref 3.87–5.11)
RDW: 13.4 % (ref 11.5–15.5)
WBC: 8.7 10*3/uL (ref 4.0–10.5)
nRBC: 0 % (ref 0.0–0.2)

## 2020-02-22 LAB — BASIC METABOLIC PANEL
Anion gap: 9 (ref 5–15)
BUN: 14 mg/dL (ref 6–20)
CO2: 28 mmol/L (ref 22–32)
Calcium: 9.3 mg/dL (ref 8.9–10.3)
Chloride: 108 mmol/L (ref 98–111)
Creatinine, Ser: 0.92 mg/dL (ref 0.44–1.00)
GFR, Estimated: >60 mL/min (ref >60)
Glucose, Bld: 123 mg/dL — ABNORMAL HIGH (ref 70–99)
Potassium: 4.3 mmol/L (ref 3.5–5.1)
Sodium: 145 mmol/L (ref 135–145)

## 2020-02-22 LAB — LIPID PANEL
Cholesterol: 179 mg/dL (ref 0–200)
HDL: 50 mg/dL (ref >40)
LDL Cholesterol: 95 mg/dL (ref 0–99)
Total CHOL/HDL Ratio: 3.6 RATIO
Triglycerides: 172 mg/dL — ABNORMAL HIGH (ref <150)
VLDL: 34 mg/dL (ref 0–40)

## 2020-02-22 LAB — D-DIMER, QUANTITATIVE: D-Dimer, Quant: 0.7 ug/mL-FEU — ABNORMAL HIGH (ref 0.00–0.50)

## 2020-02-22 LAB — HEMOGLOBIN A1C
Hgb A1c MFr Bld: 5.5 % (ref 4.8–5.6)
Mean Plasma Glucose: 111.15 mg/dL

## 2020-02-22 LAB — VITAMIN D 25 HYDROXY (VIT D DEFICIENCY, FRACTURES): Vit D, 25-Hydroxy: 22.97 ng/mL — ABNORMAL LOW (ref 30–100)

## 2020-02-22 NOTE — Patient Instructions (Signed)
Medication Instructions:  *If you need a refill on your cardiac medications before your next appointment, please call your pharmacy*  Lab Work: Your physician has recommended that you have lab work today: BMET, Lipid Panel, Vitamin D, CBC, Hemoglobin A1C, and a D-Dimer  If you have labs (blood work) drawn today and your tests are completely normal, you will receive your results only by: Marland Kitchen MyChart Message (if you have MyChart) OR . A paper copy in the mail If you have any lab test that is abnormal or we need to change your treatment, we will call you to review the results.  Testing/Procedures: Your physician has requested that you have an echocardiogram. Echocardiography is a painless test that uses sound waves to create images of your heart. It provides your doctor with information about the size and shape of your heart and how well your heart's chambers and valves are working. This procedure takes approximately one hour. There are no restrictions for this procedure.  Your physician has recommended that you have a pulmonary function test. Pulmonary Function Tests are a group of tests that measure how well air moves in and out of your lungs.  Follow-Up: At [facility], you and your health needs are our priority.  As part of our continuing mission to provide you with exceptional heart care, we have created designated Provider Care Teams.  These Care Teams include your primary Cardiologist (physician) and Advanced Practice Providers (APPs -  Physician Assistants and Nurse Practitioners) who all work together to provide you with the care you need, when you need it.  We recommend signing up for the patient portal called "MyChart".  Sign up information is provided on this After Visit Summary.  MyChart is used to connect with patients for Virtual Visits (Telemedicine).  Patients are able to view lab/test results, encounter notes, upcoming appointments, etc.  Non-urgent messages can be sent to your  provider as well.   To learn more about what you can do with MyChart, go to [email or web address].    Your next appointment:   Your follow up will be determined based on your test results. Someone from our office will contact you in regards to arranging at that time  The format for your next appointment:   In Person with Dietrich Pates, MD

## 2020-02-22 NOTE — Telephone Encounter (Signed)
-----   Message from Pricilla Riffle, MD sent at 02/22/2020  2:26 PM EST ----- CBC is normal  Lipid panel:  LDL is OK at 95   HDL 50   Triglycerides are elevatd at 172   Watch sugar intake  Cut back D Dimier is trivially elevated   I do not think signficiant   CBC normal Vit D and Hgb A1C are prending  Would get CT of chest without contrast   Hx of CP   and covid several months ago   Eval lungs

## 2020-02-22 NOTE — Telephone Encounter (Signed)
Dr.Ross cleared patient to work w/o restrictions.Pt will pick up letter tomorrow.

## 2020-02-22 NOTE — Progress Notes (Signed)
Cardiology Office Note   Date:  02/22/2020   ID:  Cathy Rowland, DOB [birth year], MRN [id number]  PCP:  Aletha Halim., PA-C  Cardiologist:   Dorris Carnes, MD   Patient referred from [facility] for CP eval    History of Present Illness: Cathy Rowland is a 49 y.o. female with a history of CP   She was seen at [facility] ED on 02/18/20   Per note the pt says the pain started on 12/23   Went to ED on 12/24 Per the patient she woke up on Thursday with chest pain.  She points to her upper chest more on the right.  Pain came and went when she laid down to ease some it would then tighten back up.  Sharp.  Not pleuritic.  Not positional.  Note she has rotator cuff problems on the right had an injection by Dr. Herma Mering in the past.  Thursday afternoon she went into work.  She works 3-11 shift.  At work she was clammy, sweaty.  She felt weak.  She continued on with her shift however.  After this she went home.  On 1224 when she woke up she was still hurting.  She went to the [facility] ED.  Records from the ED show that blood work was drawn.  Troponins were negative.  EKG was done.  She was sent home for outpatient follow-up.  Over the weekend the patient has continued to have pain pain comes and goes stabbing at times like almost like it radiates from her upper chest.  Not pleuritic.  She does note some wheezing.  Husband concurs that she has wheezing.      Current Meds  Medication Sig  . ALPRAZolam (XANAX) 1 MG tablet Take 1 mg by mouth at bedtime as needed for anxiety or sleep.  Marland Kitchen amLODipine (NORVASC) 5 MG tablet Take 5 mg by mouth daily.  . calcium-vitamin D (OSCAL WITH D) 500-200 MG-UNIT tablet Take 1 tablet by mouth daily with breakfast.  . ciprofloxacin (CIPRO) 500 MG tablet Take 500 mg by mouth 2 (two) times daily.  Marland Kitchen escitalopram (LEXAPRO) 10 MG tablet escitalopram 10 mg tablet  TAKE ONE-HALF TABLET FOR ONE WEEK THEN INCREASE TO ONE TABLET THEREAFTER  . famotidine  (PEPCID) 20 MG tablet Take 20 mg by mouth daily.  . nitrofurantoin (MACRODANTIN) 50 MG capsule Take 50 mg by mouth daily.  Marland Kitchen oxyCODONE-acetaminophen (PERCOCET) 10-325 MG per tablet Take 1 tablet by mouth every 6 (six) hours as needed for pain.  Marland Kitchen PROAIR HFA 108 (90 Base) MCG/ACT inhaler Inhale 2 puffs into the lungs every 4 (four) hours as needed for wheezing or shortness of breath.   . zinc gluconate 50 MG tablet Take 50 mg by mouth daily.     Allergies:   Aspirin, Cyclobenzaprine, Hydrocodone, Mesalamine, Septra [sulfamethoxazole-trimethoprim], and Latex   Past Medical History:  Diagnosis Date  . Anxiety   . Arthritis   . Bladder infection   . Bulging discs   . Chronic UTI   . Female bladder prolapse   . Hypertension   . Menorrhagia   . Pyelonephritis   . Uterine polyp   . Yeast infection of the vagina     Past Surgical History:  Procedure Laterality Date  . BLADDER SURGERY    . CARPAL TUNNEL RELEASE    . CERVICAL POLYPECTOMY    . CHOLECYSTECTOMY    . GYNECOLOGIC CRYOSURGERY    . WRIST SURGERY  Carpal Tunnel surgery     Social History:  The patient  reports that she has never smoked. Her smokeless tobacco use includes snuff. She reports that she does not drink alcohol and does not use drugs.   Family History:  The patient's family history includes Diabetes in her father; Headache in her sister; Hypertension in her father and sister.    ROS:  Please see the history of present illness. All other systems are reviewed and  Negative to the above problem except as noted.    PHYSICAL EXAM: VS:  BP 128/78 (BP Location: Left Arm)   Pulse 84   Ht _0  (1.626 m)   Wt 257 lb (116.6 kg)   SpO2 97%   BMI 44.11 kg/m   GEN: Morbidly obese in no acute distress  HEENT: normal  Neck: no JVD, carotid bruits, Cardiac: RRR; no murmurs, rubs, or gallops,no lower extremity edema  Chest: Tender but does not bring on all of the pain. Respiratory: Moving air okay.  Rhonchi.  No  wheezes or rales.   GI: soft, nontender, nondistended, + BS  No hepatomegaly  MS: no deformity Moving all extremities   Skin: warm and dry, no rash Neuro:  Strength and sensation are intact Psych: euthymic mood, full affect   EKG:  EKG is not ordered today.  [facility] get [facility]   Lipid Panel    Component Value Date/Time   TRIG 90 02/09/2019 1751      Wt Readings from Last 3 Encounters:  02/22/20 257 lb (116.6 kg)  02/10/19 256 lb (116.1 kg)  01/03/18 267 lb (121.1 kg)      ASSESSMENT AND PLAN: 1.  Chest pain I am not convinced it is cardiac atypical but not pleuritic, not positional, not brought on by palpation.  I would recommend labs today: D-dimer.  I would also set the patient up for a chest CT given history of Covid question significance of lung involvement.  Set the patient up for PFTs.  I would also set the patient up for an echocardiogram.  2.  Question diabetes.  Glucose sticks have been over 200.  We will check a hemoglobin A1c as well as a be met.  Discussed diet and sugar intake.  3.  Healthcare maintenance.  We will check lipids.  Discussed diet some.  Needs to cut back on sugars.  Further follow-up based on test results.    Current medicines are reviewed at length with the patient today.  The patient does not have concerns regarding medicines.  Signed, Dorris Carnes, MD  02/22/2020 9:47 AM    [address], [place], [place]  [address] Phone: [phone]; Fax: [phone]

## 2020-02-22 NOTE — Telephone Encounter (Signed)
Lab results given to patient, order placed for non contrast chest ct

## 2020-02-22 NOTE — Telephone Encounter (Signed)
New message     Her Job is not going to let her work with restrictions , they want her to have a note taking her out of work from today to January 3,2022

## 2020-02-23 ENCOUNTER — Ambulatory Visit (HOSPITAL_COMMUNITY)
Admission: RE | Admit: 2020-02-23 | Discharge: 2020-02-23 | Disposition: A | Discharge status: Home / Self Care | Payer: PRIVATE HEALTH INSURANCE | Source: Ambulatory Visit | Attending: Internal Medicine | Admitting: Internal Medicine

## 2020-02-23 ENCOUNTER — Other Ambulatory Visit: Payer: Self-pay

## 2020-02-23 DIAGNOSIS — R079 Chest pain, unspecified: Secondary | ICD-10-CM | POA: Insufficient documentation

## 2020-02-23 LAB — ECHOCARDIOGRAM COMPLETE
Area-P 1/2: 3.74 cm2
S' Lateral: 2.63 cm

## 2020-02-23 NOTE — Progress Notes (Signed)
*  PRELIMINARY RESULTS* Echocardiogram 2D Echocardiogram has been performed.  Stacey Drain 02/23/2020, 1:46 PM

## 2020-02-24 ENCOUNTER — Telehealth: Payer: Self-pay

## 2020-02-24 NOTE — Telephone Encounter (Signed)
-----   Message from Nori Riis, RN sent at 02/24/2020  7:21 AM EST -----  ----- Message ----- From: Pricilla Riffle, MD Sent: 02/23/2020   8:56 PM EST To: Nori Riis, RN  Echo is normal Normal pumping and relaxing function of heart Normal valve function

## 2020-02-24 NOTE — Telephone Encounter (Signed)
Contacted patient to let her know the results of her Echo per Dr. Tenny Craw. She verbalized understanding. Routed a copy to her PCP.

## 2020-03-03 ENCOUNTER — Inpatient Hospital Stay (HOSPITAL_COMMUNITY): Admission: RE | Admit: 2020-03-03 | Payer: PRIVATE HEALTH INSURANCE | Source: Ambulatory Visit

## 2020-03-16 ENCOUNTER — Encounter (HOSPITAL_COMMUNITY): Payer: Self-pay

## 2020-03-16 ENCOUNTER — Ambulatory Visit (HOSPITAL_COMMUNITY): Admission: RE | Admit: 2020-03-16 | Payer: PRIVATE HEALTH INSURANCE | Source: Ambulatory Visit

## 2020-03-17 ENCOUNTER — Inpatient Hospital Stay (HOSPITAL_COMMUNITY): Admission: RE | Admit: 2020-03-17 | Payer: PRIVATE HEALTH INSURANCE | Source: Ambulatory Visit

## 2020-03-31 ENCOUNTER — Inpatient Hospital Stay (HOSPITAL_COMMUNITY): Admission: RE | Admit: 2020-03-31 | Payer: PRIVATE HEALTH INSURANCE | Source: Ambulatory Visit

## 2020-09-01 ENCOUNTER — Encounter (HOSPITAL_BASED_OUTPATIENT_CLINIC_OR_DEPARTMENT_OTHER): Payer: Self-pay | Admitting: *Deleted

## 2020-09-01 ENCOUNTER — Emergency Department (HOSPITAL_BASED_OUTPATIENT_CLINIC_OR_DEPARTMENT_OTHER): Payer: PRIVATE HEALTH INSURANCE

## 2020-09-01 ENCOUNTER — Emergency Department (HOSPITAL_BASED_OUTPATIENT_CLINIC_OR_DEPARTMENT_OTHER)
Admission: EM | Admit: 2020-09-01 | Discharge: 2020-09-01 | Disposition: A | Discharge status: Home / Self Care | Payer: PRIVATE HEALTH INSURANCE | Attending: Emergency Medicine | Admitting: Emergency Medicine

## 2020-09-01 ENCOUNTER — Other Ambulatory Visit: Payer: Self-pay

## 2020-09-01 DIAGNOSIS — I10 Essential (primary) hypertension: Secondary | ICD-10-CM | POA: Insufficient documentation

## 2020-09-01 DIAGNOSIS — F17220 Nicotine dependence, chewing tobacco, uncomplicated: Secondary | ICD-10-CM | POA: Diagnosis not present

## 2020-09-01 DIAGNOSIS — Z91040 Latex allergy status: Secondary | ICD-10-CM | POA: Diagnosis not present

## 2020-09-01 DIAGNOSIS — R1031 Right lower quadrant pain: Secondary | ICD-10-CM | POA: Insufficient documentation

## 2020-09-01 DIAGNOSIS — R140 Abdominal distension (gaseous): Secondary | ICD-10-CM | POA: Diagnosis not present

## 2020-09-01 DIAGNOSIS — Z79899 Other long term (current) drug therapy: Secondary | ICD-10-CM | POA: Insufficient documentation

## 2020-09-01 DIAGNOSIS — Z8616 Personal history of COVID-19: Secondary | ICD-10-CM | POA: Insufficient documentation

## 2020-09-01 DIAGNOSIS — R1013 Epigastric pain: Secondary | ICD-10-CM | POA: Insufficient documentation

## 2020-09-01 LAB — COMPREHENSIVE METABOLIC PANEL
ALT: 14 U/L (ref 0–44)
AST: 19 U/L (ref 15–41)
Albumin: 3.9 g/dL (ref 3.5–5.0)
Alkaline Phosphatase: 56 U/L (ref 38–126)
Anion gap: 5 (ref 5–15)
BUN: 11 mg/dL (ref 6–20)
CO2: 30 mmol/L (ref 22–32)
Calcium: 9.1 mg/dL (ref 8.9–10.3)
Chloride: 110 mmol/L (ref 98–111)
Creatinine, Ser: 0.87 mg/dL (ref 0.44–1.00)
GFR, Estimated: >60 mL/min (ref >60)
Glucose, Bld: 88 mg/dL (ref 70–99)
Potassium: 3.7 mmol/L (ref 3.5–5.1)
Sodium: 145 mmol/L (ref 135–145)
Total Bilirubin: 0.4 mg/dL (ref 0.3–1.2)
Total Protein: 7 g/dL (ref 6.5–8.1)

## 2020-09-01 LAB — CBC
HCT: 43 % (ref 36.0–46.0)
Hemoglobin: 13.9 g/dL (ref 12.0–15.0)
MCH: 29.2 pg (ref 26.0–34.0)
MCHC: 32.3 g/dL (ref 30.0–36.0)
MCV: 90.3 fL (ref 80.0–100.0)
Platelets: 241 10*3/uL (ref 150–400)
RBC: 4.76 MIL/uL (ref 3.87–5.11)
RDW: 13.2 % (ref 11.5–15.5)
WBC: 8.1 10*3/uL (ref 4.0–10.5)
nRBC: 0 % (ref 0.0–0.2)

## 2020-09-01 LAB — URINALYSIS, ROUTINE W REFLEX MICROSCOPIC
Bilirubin Urine: NEGATIVE
Glucose, UA: NEGATIVE mg/dL
Hgb urine dipstick: NEGATIVE
Ketones, ur: NEGATIVE mg/dL
Leukocytes,Ua: NEGATIVE
Nitrite: NEGATIVE
Protein, ur: NEGATIVE mg/dL
Specific Gravity, Urine: 1.015 (ref 1.005–1.030)
pH: 6 (ref 5.0–8.0)

## 2020-09-01 LAB — LIPASE, BLOOD: Lipase: 26 U/L (ref 11–51)

## 2020-09-01 LAB — PREGNANCY, URINE: Preg Test, Ur: NEGATIVE

## 2020-09-01 MED ORDER — IOHEXOL 300 MG/ML  SOLN
100.0000 mL | Freq: Once | INTRAMUSCULAR | Status: AC | PRN
  Administered 2020-09-01: 100 mL via INTRAVENOUS

## 2020-09-01 NOTE — ED Provider Notes (Signed)
[facility] EMERGENCY DEPARTMENT Provider Note   CSN: [id number] Arrival date & time: 09/01/20  1449     History Chief Complaint  Patient presents with   Abdominal Pain    Cathy Rowland is a 50 y.o. female with past medical history of bladder infections, chronic UTIs, hypertension, pyelonephritis that presents to the emerge department today for abdominal pain for the past week..  Patient states that pain is primarily in her epigastric region and in her right lower quadrant.  States that she feels extremely bloated, states that she feews " pregnant."  Denies any fluid in her legs or her face, no history of CHF.  Denies any dysuria or hematuria.  Patient states that she has not had a bowel movement in the past 2 days, prior to this did not have a bowel movement for couple days.  No bloody bowel movements.  Denies any nausea or vomiting.  Denies any fevers.  Denies any chest pain or shortness of breath.  Patient has had a cholecystectomy and bladder surgery.  Denies any vaginal symptoms.  Has been eating and drinking normally.  Denies any substance use.  Abdominal pain will radiate sometimes into her back.  States that it is sharp and constant.  No other complaints at this time.  HPI     Past Medical History:  Diagnosis Date   Anxiety    Arthritis    Bladder infection    Bulging discs    Chronic UTI    Female bladder prolapse    Hypertension    Menorrhagia    Pyelonephritis    Uterine polyp    Yeast infection of the vagina     Patient Active Problem List   Diagnosis Date Noted   Pneumonia due to COVID-19 virus 02/10/2019   Suspected COVID-19 virus infection 02/09/2019   Pneumonia 02/09/2019   Acute respiratory failure with hypoxia (HCC) 02/09/2019   Anxiety 02/09/2019   History of recurrent UTI (urinary tract infection) 02/09/2019   Pain in limb 06/10/2013   Swelling of limb 06/10/2013   Right shoulder pain 07/19/2011   Carpal tunnel syndrome, right 07/19/2011    ACUTE BRONCHITIS 04/02/2010   FLANK PAIN, RIGHT 04/02/2010    Past Surgical History:  Procedure Laterality Date   BLADDER SURGERY     CARPAL TUNNEL RELEASE     CERVICAL POLYPECTOMY     CHOLECYSTECTOMY     GYNECOLOGIC CRYOSURGERY     WRIST SURGERY     Carpal Tunnel surgery     OB History     Gravida  3   Para  3   Term  2   Preterm  1   AB      Living  3      SAB      IAB      Ectopic      Multiple      Live Births              Family History  Problem Relation Age of Onset   Diabetes Father    Hypertension Father    Hypertension Sister    Headache Sister    Heart attack Neg Hx    Hyperlipidemia Neg Hx     Social History   Tobacco Use   Smoking status: Never   Smokeless tobacco: Current    Types: Snuff   Tobacco comments:    1 box every 2-3 days.  Vaping Use   Vaping Use: Never used  Substance  Use Topics   Alcohol use: No   Drug use: No    Home Medications Prior to Admission medications   Medication Sig Start Date End Date Taking? Authorizing Provider  ALPRAZolam Prudy Feeler(XANAX) 1 MG tablet Take 1 mg by mouth at bedtime as needed for anxiety or sleep.   Yes [provider]  oxyCODONE-acetaminophen (PERCOCET) 10-325 MG per tablet Take 1 tablet by mouth every 6 (six) hours as needed for pain.   Yes [provider]  amLODipine (NORVASC) 5 MG tablet Take 5 mg by mouth daily.    [provider]  calcium-vitamin D (OSCAL WITH D) 500-200 MG-UNIT tablet Take 1 tablet by mouth daily with breakfast.    [provider]  ciprofloxacin (CIPRO) 500 MG tablet Take 500 mg by mouth 2 (two) times daily.    [provider]  escitalopram (LEXAPRO) 10 MG tablet escitalopram 10 mg tablet  TAKE ONE-HALF TABLET FOR ONE WEEK THEN INCREASE TO ONE TABLET THEREAFTER 08/11/19   [provider]  nitrofurantoin (MACRODANTIN) 50 MG capsule Take 50 mg by mouth daily.    [provider]  PROAIR HFA 108 [phone](90 Base)  MCG/ACT inhaler Inhale 2 puffs into the lungs every 4 (four) hours as needed for wheezing or shortness of breath.  03/14/15   [provider]  zinc gluconate 50 MG tablet Take 50 mg by mouth daily.    [provider]    Allergies    Aspirin, Cyclobenzaprine, Hydrocodone, Mesalamine, Septra [sulfamethoxazole-trimethoprim], and Latex  Review of Systems   Review of Systems  Constitutional:  Negative for chills, diaphoresis, fatigue and fever.  HENT:  Negative for congestion, sore throat and trouble swallowing.   Eyes:  Negative for pain and visual disturbance.  Respiratory:  Negative for cough, shortness of breath and wheezing.   Cardiovascular:  Negative for chest pain, palpitations and leg swelling.  Gastrointestinal:  Positive for abdominal distention, abdominal pain and constipation. Negative for diarrhea, nausea and vomiting.  Genitourinary:  Negative for difficulty urinating.  Musculoskeletal:  Negative for back pain, neck pain and neck stiffness.  Skin:  Negative for pallor.  Neurological:  Negative for dizziness, speech difficulty, weakness and headaches.  Psychiatric/Behavioral:  Negative for confusion.    Physical Exam Updated Vital Signs BP (!) 146/79   Pulse 84   Temp 98.1 F (36.7 C) (Oral)   Resp 18   Ht 5\' 4"  (1.626 m)   Wt 116.6 kg   SpO2 98%   BMI 44.12 kg/m   Physical Exam Constitutional:      General: She is not in acute distress.    Appearance: Normal appearance. She is not ill-appearing, toxic-appearing or diaphoretic.  HENT:     Mouth/Throat:     Mouth: Mucous membranes are moist.     Pharynx: Oropharynx is clear.  Eyes:     General: No scleral icterus.    Extraocular Movements: Extraocular movements intact.     Pupils: Pupils are equal, round, and reactive to light.  Cardiovascular:     Rate and Rhythm: Normal rate and regular rhythm.     Pulses: Normal pulses.     Heart sounds: Normal heart sounds.  Pulmonary:     Effort:  Pulmonary effort is normal. No respiratory distress.     Breath sounds: Normal breath sounds. No stridor. No wheezing, rhonchi or rales.  Chest:     Chest wall: No tenderness.  Abdominal:     General: Abdomen is flat. There is no distension.  Palpations: Abdomen is soft.     Tenderness: There is abdominal tenderness in the right lower quadrant and epigastric area. There is no guarding or rebound.     Comments: Patient with epigastric pain and right lower quadrant pain.  No shifting dullness or fluid wave.  Musculoskeletal:        General: No swelling or tenderness. Normal range of motion.     Cervical back: Normal range of motion and neck supple. No rigidity.     Right lower leg: No edema.     Left lower leg: No edema.  Skin:    General: Skin is warm and dry.     Capillary Refill: Capillary refill takes less than 2 seconds.     Coloration: Skin is not pale.  Neurological:     General: No focal deficit present.     Mental Status: She is alert and oriented to person, place, and time.  Psychiatric:        Mood and Affect: Mood normal.        Behavior: Behavior normal.    ED Results / Procedures / Treatments   Labs (all labs ordered are listed, but only abnormal results are displayed) Labs Reviewed  LIPASE, BLOOD  COMPREHENSIVE METABOLIC PANEL  CBC  URINALYSIS, ROUTINE W REFLEX MICROSCOPIC  PREGNANCY, URINE    EKG None  Radiology CT Abdomen Pelvis W Contrast  Result Date: 09/01/2020 CLINICAL DATA:  Right lower quadrant abdominal pain, epigastric pain, pain and bloating for a week EXAM: CT ABDOMEN AND PELVIS WITH CONTRAST TECHNIQUE: Multidetector CT imaging of the abdomen and pelvis was performed using the standard protocol following bolus administration of intravenous contrast. CONTRAST:  OMNIPAQUE IOHEXOL 300 MG/ML  SOLN COMPARISON:  01/03/2018 FINDINGS: Lower chest: No acute abnormality. Hepatobiliary: No focal liver abnormality is seen. Hepatomegaly and hepatic  steatosis, maximum coronal span 23 cm. Status post cholecystectomy. No biliary dilatation. Pancreas: Unremarkable. No pancreatic ductal dilatation or surrounding inflammatory changes. Spleen: Normal in size without significant abnormality. Adrenals/Urinary Tract: Adrenal glands are unremarkable. Large left parapelvic cysts. Kidneys are otherwise normal, without renal calculi, solid lesion, or hydronephrosis. Bladder is unremarkable. Stomach/Bowel: Stomach is within normal limits. Appendix appears normal. No evidence of bowel wall thickening, distention, or inflammatory changes. Vascular/Lymphatic: Scattered aortic atherosclerosis. No enlarged abdominal or pelvic lymph nodes. Reproductive: No mass or other significant abnormality. Other: No abdominal wall hernia or abnormality. No abdominopelvic ascites. Musculoskeletal: No acute or significant osseous findings. IMPRESSION: 1. No acute CT findings of the abdomen or pelvis to explain right lower quadrant pain. Normal appendix. 2. Hepatomegaly and hepatic steatosis. 3. Status post cholecystectomy. Aortic Atherosclerosis (ICD10-I70.0). Electronically Signed   By: Lauralyn Primes M.D.   On: 09/01/2020 19:05    Procedures Procedures   Medications Ordered in ED Medications  iohexol (OMNIPAQUE) 300 MG/ML solution 100 mL (100 mLs Intravenous Contrast Given 09/01/20 1831)    ED Course  I have reviewed the triage vital signs and the nursing notes.  Pertinent labs & imaging results that were available during my care of the patient were reviewed by me and considered in my medical decision making (see chart for details).    MDM Rules/Calculators/A&P                          TANZIE ROTHSCHILD is a 50 y.o. female with past medical history of bladder infections, chronic UTIs, hypertension, pyelonephritis that presents to the emerge department today for abdominal pain  for the past week.  Patient looks very well, vitals are stable.  Differential to include constipation.   Low concerns for ACS, dissection or surgical abdomen.   Work-up today with CMP unremarkable, reassuring that LFTs are normal.  CBC without any white count or anemia.  Lipase normal.  Urinalysis unremarkable, no hemoglobin in urine or protein in urine, nephrotic syndrome less likely.  CT abdomen negative.  I think patient is most likely having pains related to gas and food allergies that are upsetting her stomach, discussed that patient needs to see a GI doctor, she agrees.  Did discuss food diary options and food elimination processes, patient will follow up with GI doctor.  Upon repeat exam, patient appears very well, no abdominal tenderness, patient to be discharged at this time.   Doubt need for further emergent work up at this time. I explained the diagnosis and have given explicit precautions to return to the ER including for any other new or worsening symptoms. The patient understands and accepts the medical plan as it's been dictated and I have answered their questions. Discharge instructions concerning home care and prescriptions have been given. The patient is STABLE and is discharged to home in good condition.   Final Clinical Impression(s) / ED Diagnoses Final diagnoses:  Abdominal bloating    Rx / DC Orders ED Discharge Orders     None        Farrel Gordon, PA-C 09/01/20 2010    Little, Ambrose Finland, MD 09/01/20 2053

## 2020-09-01 NOTE — Discharge Instructions (Addendum)
  You were evaluated in the Emergency Department and after careful evaluation, we did not find any emergent condition requiring admission or further testing in the hospital.   Your exam/testing today was overall reassuring.  Please follow-up with a GI doctor for the colonoscopy as we discussed in addition to food testing for your abdominal bloating.  Try to eliminate certain foods this week and keep a food diary.  Please use the attached instructions. Please return to the Emergency Department if you experience any worsening of your condition.  Thank you for allowing [place] to be a part of your care. Please speak to your pharmacist about any new medications prescribed today in regards to side effects or interactions with other medications.

## 2020-09-01 NOTE — ED Notes (Signed)
Patient transported to CT 

## 2020-09-01 NOTE — ED Triage Notes (Signed)
Abdominal pain and bloating for over a week. States she has an abdominal hernia. BM's are normal.

## 2020-10-09 ENCOUNTER — Other Ambulatory Visit: Payer: Self-pay | Admitting: Family Medicine

## 2020-10-09 DIAGNOSIS — Z1231 Encounter for screening mammogram for malignant neoplasm of breast: Secondary | ICD-10-CM

## 2020-11-16 ENCOUNTER — Institutional Professional Consult (permissible substitution): Payer: PRIVATE HEALTH INSURANCE | Admitting: Pulmonary Disease

## 2020-11-16 NOTE — Progress Notes (Deleted)
Synopsis: Referred in September 2022 for post-covid symptoms by Cathy Gemma, PA  Subjective:   PATIENT ID: Cathy Rowland GENDER: female DOB: May 26, 1970, MRN: [id number]   HPI  No chief complaint on file.   Cathy Rowland is a 50 year old woman, never smoker but uses chewing tobacco with history of hypertension, sleep apnea, obesity and hiatal hernia who is referred to pulmonary clinic for shortness of breath.   She had covid 19 in 01/2019.   Past Medical History:  Diagnosis Date   Anxiety    Arthritis    Bladder infection    Bulging discs    Chronic UTI    Female bladder prolapse    Hypertension    Menorrhagia    Pyelonephritis    Uterine polyp    Yeast infection of the vagina      Family History  Problem Relation Age of Onset   Diabetes Father    Hypertension Father    Hypertension Sister    Headache Sister    Heart attack Neg Hx    Hyperlipidemia Neg Hx      Social History   Socioeconomic History   Marital status: Married    Spouse name: Vincenza Dail   Number of children: Not on file   Years of education: Not on file   Highest education level: Not on file  Occupational History   Not on file  Tobacco Use   Smoking status: Never   Smokeless tobacco: Current    Types: Snuff   Tobacco comments:    1 box every 2-3 days.  Vaping Use   Vaping Use: Never used  Substance and Sexual Activity   Alcohol use: No   Drug use: No   Sexual activity: Yes    Birth control/protection: Implant  Other Topics Concern   Not on file  Social History Narrative   Not on file   Social Determinants of Health   Financial Resource Strain: Not on file  Food Insecurity: Not on file  Transportation Needs: Not on file  Physical Activity: Not on file  Stress: Not on file  Social Connections: Not on file  Intimate Partner Violence: Not on file     Allergies  Allergen Reactions   Aspirin Other (See Comments)    Upsets ulcer   Cyclobenzaprine Other (See Comments)     Unknown   Hydrocodone Other (See Comments)    Fidgety and jittery   Mesalamine Other (See Comments)    Upsets ulcer   Septra [Sulfamethoxazole-Trimethoprim] Nausea And Vomiting   Latex Rash    Unknown     Outpatient Medications Prior to Visit  Medication Sig Dispense Refill   ALPRAZolam (XANAX) 1 MG tablet Take 1 mg by mouth at bedtime as needed for anxiety or sleep.     amLODipine (NORVASC) 5 MG tablet Take 5 mg by mouth daily.     calcium-vitamin D (OSCAL WITH D) 500-200 MG-UNIT tablet Take 1 tablet by mouth daily with breakfast.     ciprofloxacin (CIPRO) 500 MG tablet Take 500 mg by mouth 2 (two) times daily.     escitalopram (LEXAPRO) 10 MG tablet escitalopram 10 mg tablet  TAKE ONE-HALF TABLET FOR ONE WEEK THEN INCREASE TO ONE TABLET THEREAFTER     nitrofurantoin (MACRODANTIN) 50 MG capsule Take 50 mg by mouth daily.     oxyCODONE-acetaminophen (PERCOCET) 10-325 MG per tablet Take 1 tablet by mouth every 6 (six) hours as needed for pain.     PROAIR HFA  108 (90 Base) MCG/ACT inhaler Inhale 2 puffs into the lungs every 4 (four) hours as needed for wheezing or shortness of breath.   11   zinc gluconate 50 MG tablet Take 50 mg by mouth daily.     No facility-administered medications prior to visit.    Review of Systems  Constitutional:  Negative for chills, fever, malaise/fatigue and weight loss.  HENT:  Negative for congestion, sinus pain and sore throat.   Eyes: Negative.   Respiratory:  Negative for cough, hemoptysis, sputum production, shortness of breath and wheezing.   Cardiovascular:  Negative for chest pain, palpitations, orthopnea, claudication and leg swelling.  Gastrointestinal:  Negative for abdominal pain, heartburn, nausea and vomiting.  Genitourinary: Negative.   Musculoskeletal:  Negative for joint pain and myalgias.  Skin:  Negative for rash.  Neurological:  Negative for weakness.  Endo/Heme/Allergies: Negative.   Psychiatric/Behavioral: Negative.        Objective:  There were no vitals filed for this visit.   Physical Exam Constitutional:      General: She is not in acute distress.    Appearance: She is not ill-appearing.  HENT:     Head: Normocephalic and atraumatic.  Eyes:     General: No scleral icterus.    Conjunctiva/sclera: Conjunctivae normal.     Pupils: Pupils are equal, round, and reactive to light.  Cardiovascular:     Rate and Rhythm: Normal rate and regular rhythm.     Pulses: Normal pulses.     Heart sounds: Normal heart sounds. No murmur heard. Pulmonary:     Effort: Pulmonary effort is normal.     Breath sounds: Normal breath sounds. No wheezing, rhonchi or rales.  Abdominal:     General: Bowel sounds are normal.     Palpations: Abdomen is soft.  Musculoskeletal:     Right lower leg: No edema.     Left lower leg: No edema.  Lymphadenopathy:     Cervical: No cervical adenopathy.  Skin:    General: Skin is warm and dry.  Neurological:     General: No focal deficit present.     Mental Status: She is alert.  Psychiatric:        Mood and Affect: Mood normal.        Behavior: Behavior normal.        Thought Content: Thought content normal.        Judgment: Judgment normal.    CBC    Component Value Date/Time   WBC 8.1 09/01/2020 1502   RBC 4.76 09/01/2020 1502   HGB 13.9 09/01/2020 1502   HCT 43.0 09/01/2020 1502   PLT 241 09/01/2020 1502   MCV 90.3 09/01/2020 1502   MCH 29.2 09/01/2020 1502   MCHC 32.3 09/01/2020 1502   RDW 13.2 09/01/2020 1502   LYMPHSABS 3.1 02/22/2020 1103   MONOABS 0.6 02/22/2020 1103   EOSABS 0.2 02/22/2020 1103   BASOSABS 0.1 02/22/2020 1103   BMP Latest Ref Rng & Units 09/01/2020 02/22/2020 02/13/2019  Glucose 70 - 99 mg/dL 88 270(W) 237(S)  BUN 6 - 20 mg/dL 11 14 28(B)  Creatinine 0.44 - 1.00 mg/dL 1.51 7.61 6.07  Sodium 135 - 145 mmol/L 145 145 148(H)  Potassium 3.5 - 5.1 mmol/L 3.7 4.3 3.6  Chloride 98 - 111 mmol/L 110 108 111  CO2 22 - 32 mmol/L 30 28 26    Calcium 8.9 - 10.3 mg/dL 9.1 9.3 8.9    Chest imaging: CT Abdomen 09/01/20 Lower lung fields  are normal  CXR 01/2020 The heart size and mediastinal contours are within normal limits.  Both lungs are clear. The visualized skeletal structures are  unremarkable.  CXR 01/2019 There is hazy bilateral airspace disease. Heart size is upper normal. No pneumothorax or pleural fusion. PFT: No flowsheet data found.  Echo 02/23/20: LV EF 65-70%. RV systolic function is normal. RV size is normal. LA is normal size and RA is normal size.   Heart Catheterization:       Assessment & Plan:   Shortness of breath  Discussion:   Elevated CO2 levels.    Current Outpatient Medications:    ALPRAZolam (XANAX) 1 MG tablet, Take 1 mg by mouth at bedtime as needed for anxiety or sleep., Disp: , Rfl:    amLODipine (NORVASC) 5 MG tablet, Take 5 mg by mouth daily., Disp: , Rfl:    calcium-vitamin D (OSCAL WITH D) 500-200 MG-UNIT tablet, Take 1 tablet by mouth daily with breakfast., Disp: , Rfl:    ciprofloxacin (CIPRO) 500 MG tablet, Take 500 mg by mouth 2 (two) times daily., Disp: , Rfl:    escitalopram (LEXAPRO) 10 MG tablet, escitalopram 10 mg tablet  TAKE ONE-HALF TABLET FOR ONE WEEK THEN INCREASE TO ONE TABLET THEREAFTER, Disp: , Rfl:    nitrofurantoin (MACRODANTIN) 50 MG capsule, Take 50 mg by mouth daily., Disp: , Rfl:    oxyCODONE-acetaminophen (PERCOCET) 10-325 MG per tablet, Take 1 tablet by mouth every 6 (six) hours as needed for pain., Disp: , Rfl:    PROAIR HFA 108 (90 Base) MCG/ACT inhaler, Inhale 2 puffs into the lungs every 4 (four) hours as needed for wheezing or shortness of breath. , Disp: , Rfl: 11   zinc gluconate 50 MG tablet, Take 50 mg by mouth daily., Disp: , Rfl:

## 2020-11-27 ENCOUNTER — Ambulatory Visit: Payer: PRIVATE HEALTH INSURANCE

## 2020-12-25 ENCOUNTER — Ambulatory Visit: Payer: PRIVATE HEALTH INSURANCE

## 2021-01-31 ENCOUNTER — Ambulatory Visit: Payer: PRIVATE HEALTH INSURANCE

## 2021-03-26 ENCOUNTER — Other Ambulatory Visit (HOSPITAL_COMMUNITY): Payer: Self-pay | Admitting: Nurse Practitioner

## 2021-03-26 DIAGNOSIS — Z1231 Encounter for screening mammogram for malignant neoplasm of breast: Secondary | ICD-10-CM

## 2021-04-02 ENCOUNTER — Other Ambulatory Visit: Payer: Self-pay

## 2021-04-02 ENCOUNTER — Ambulatory Visit (HOSPITAL_COMMUNITY)
Admission: RE | Admit: 2021-04-02 | Discharge: 2021-04-02 | Disposition: A | Discharge status: Home / Self Care | Payer: Self-pay | Source: Ambulatory Visit | Attending: Nurse Practitioner | Admitting: Nurse Practitioner

## 2021-04-02 DIAGNOSIS — Z1231 Encounter for screening mammogram for malignant neoplasm of breast: Secondary | ICD-10-CM | POA: Insufficient documentation

## 2022-09-03 ENCOUNTER — Encounter: Payer: Self-pay | Admitting: Gastroenterology

## 2022-11-14 ENCOUNTER — Ambulatory Visit (INDEPENDENT_AMBULATORY_CARE_PROVIDER_SITE_OTHER): Payer: Managed Care, Other (non HMO) | Admitting: Gastroenterology

## 2022-11-14 ENCOUNTER — Encounter: Payer: Self-pay | Admitting: Gastroenterology

## 2022-11-14 VITALS — BP 140/88 | HR 100 | Ht 64.0 in | Wt 296.0 lb

## 2022-11-14 DIAGNOSIS — R194 Change in bowel habit: Secondary | ICD-10-CM

## 2022-11-14 DIAGNOSIS — K5900 Constipation, unspecified: Secondary | ICD-10-CM

## 2022-11-14 DIAGNOSIS — Z1211 Encounter for screening for malignant neoplasm of colon: Secondary | ICD-10-CM | POA: Diagnosis not present

## 2022-11-14 MED ORDER — NA SULFATE-K SULFATE-MG SULF 17.5-3.13-1.6 GM/177ML PO SOLN: ORAL | 0 refills | Status: AC

## 2022-11-14 NOTE — Progress Notes (Signed)
Chief Complaint: Screening colonoscopy Primary GI MD: Gentry Fitz  HPI: 52 year old pleasant female history of chronic pain on Percocet, anxiety, morbid obesity with BMI of 50, presents for evaluation of the screening colonoscopy.  Patient states she has never had a colonoscopy and she knows she is due because of her age.  She reports a family history of colon cancer in her maternal grandmother age 92s to 71s.  Reports her grandmother had a colostomy bag.  Patient denies melena/hematochezia.  She states over the last year she has become progressively constipated.  Constipation is intermittent but she notes she does have to strain more often.  She is on chronic narcotic pain medication for chronic pain for the last 10 years.  She does note a weight gain of about 30 pounds since December 2023.  She has tried diet and exercise without success.  She does note her father passed away around the time she began gaining weight.  Denies upper GI symptoms.  Denies NSAID use. No previous colonoscopy  Recent CBC/CMP/TSH all within normal limits  Past Medical History:  Diagnosis Date   Anxiety    Arthritis    Bladder infection    Bulging discs    Chronic UTI    Female bladder prolapse    Hypertension    Menorrhagia    Pyelonephritis    Uterine polyp    Yeast infection of the vagina     Past Surgical History:  Procedure Laterality Date   BLADDER SURGERY     CARPAL TUNNEL RELEASE     CERVICAL POLYPECTOMY     CHOLECYSTECTOMY     GYNECOLOGIC CRYOSURGERY     WRIST SURGERY     Carpal Tunnel surgery    Current Outpatient Medications  Medication Sig Dispense Refill   ALPRAZolam (XANAX) 1 MG tablet Take 1 mg by mouth at bedtime as needed for anxiety or sleep.     amLODipine (NORVASC) 5 MG tablet Take 5 mg by mouth daily.     calcium-vitamin D (OSCAL WITH D) 500-200 MG-UNIT tablet Take 1 tablet by mouth daily with breakfast.     ciprofloxacin (CIPRO) 500 MG tablet Take 500 mg by mouth 2  (two) times daily.     escitalopram (LEXAPRO) 10 MG tablet escitalopram 10 mg tablet  TAKE ONE-HALF TABLET FOR ONE WEEK THEN INCREASE TO ONE TABLET THEREAFTER     losartan (COZAAR) 25 MG tablet Take 1 tablet every day by oral route.     nitrofurantoin (MACRODANTIN) 50 MG capsule Take 50 mg by mouth daily.     oxyCODONE-acetaminophen (PERCOCET) 10-325 MG per tablet Take 1 tablet by mouth every 6 (six) hours as needed for pain.     PROAIR HFA 108 (90 Base) MCG/ACT inhaler Inhale 2 puffs into the lungs every 4 (four) hours as needed for wheezing or shortness of breath.   11   zinc gluconate 50 MG tablet Take 50 mg by mouth daily.     No current facility-administered medications for this visit.    Allergies as of 11/14/2022 - Review Complete 11/14/2022  Allergen Reaction Noted   Aspirin Other (See Comments) 04/02/2010   Cyclobenzaprine Other (See Comments) 05/21/2015   Hydrocodone Other (See Comments) 12/01/2010   Mesalamine Other (See Comments) 05/21/2015   Septra [sulfamethoxazole-trimethoprim] Nausea And Vomiting 11/19/2011   Latex Rash 05/21/2015    Family History  Problem Relation Age of Onset   Diabetes Father    Hypertension Father    Hypertension Sister    Headache  Sister    Colon cancer Maternal Grandmother    Diabetes Maternal Grandmother    Heart attack Maternal Grandmother    Diabetes Paternal Grandmother    Kidney disease Paternal Grandmother    Diabetes Paternal Grandfather    Kidney disease Paternal Grandfather    Gallbladder disease Paternal Grandfather    Liver disease Neg Hx    Esophageal cancer Neg Hx     Social History   Socioeconomic History   Marital status: Married    Spouse name: Dorreen Oke   Number of children: Not on file   Years of education: Not on file   Highest education level: Not on file  Occupational History   Occupation: cna  Tobacco Use   Smoking status: Never   Smokeless tobacco: Current    Types: Snuff   Tobacco comments:    1  box every 2-3 days.  Vaping Use   Vaping status: Never Used  Substance and Sexual Activity   Alcohol use: No   Drug use: No   Sexual activity: Yes    Birth control/protection: Implant  Other Topics Concern   Not on file  Social History Narrative   Not on file   Social Determinants of Health   Financial Resource Strain: Not on file  Food Insecurity: Not on file  Transportation Needs: Not on file  Physical Activity: Not on file  Stress: Not on file  Social Connections: Unknown (07/08/2021)   Received from [facility], [facility]   Social Network    Social Network: Not on file  Intimate Partner Violence: Unknown (05/30/2021)   Received from [employer], [facility]   HITS    Physically Hurt: Not on file    Insult or Talk Down To: Not on file    Threaten Physical Harm: Not on file    Scream or Curse: Not on file    Review of Systems:    Constitutional: No weight loss, fever, chills, weakness or fatigue HEENT: Eyes: No change in vision               Ears, Nose, Throat:  No change in hearing or congestion Skin: No rash or itching Cardiovascular: No chest pain, chest pressure or palpitations   Respiratory: No SOB or cough Gastrointestinal: See HPI and otherwise negative Genitourinary: No dysuria or change in urinary frequency Neurological: No headache, dizziness or syncope Musculoskeletal: No new muscle or joint pain Hematologic: No bleeding or bruising Psychiatric: No history of depression or anxiety    Physical Exam:  Vital signs: BP (!) 140/88   Pulse 100   Ht 5\' 4"  (1.626 m)   Wt 134.3 kg   BMI 50.81 kg/m   Constitutional: Pleasant obese female with husband accompanying her Head:  Normocephalic and atraumatic. Eyes:   PEERL, EOMI. No icterus. Conjunctiva pink. Mouth/Throat: "Snuff" dried on outer crevices of lips Respiratory: Respirations even and unlabored. Lungs clear to auscultation bilaterally.   No wheezes, crackles, or rhonchi.  Cardiovascular:   Regular rate and rhythm. No peripheral edema, cyanosis or pallor.  Gastrointestinal:  Soft, nondistended, nontender. Protuberant. No rebound or guarding. Normal bowel sounds. No appreciable masses or hepatomegaly. Rectal:  Not performed.  Msk:  Symmetrical without gross deformities. Without edema, no deformity or joint abnormality.  Neurologic:  Alert and  oriented x4;  grossly normal neurologically.  Skin:   Dry and intact without significant lesions or rashes. Psychiatric: Oriented to person, place and time. Demonstrates good judgement and reason without abnormal affect or  behaviors.   RELEVANT LABS AND IMAGING: CBC    Component Value Date/Time   WBC 8.1 09/01/2020 1502   RBC 4.76 09/01/2020 1502   HGB 13.9 09/01/2020 1502   HCT 43.0 09/01/2020 1502   PLT 241 09/01/2020 1502   MCV 90.3 09/01/2020 1502   MCH 29.2 09/01/2020 1502   MCHC 32.3 09/01/2020 1502   RDW 13.2 09/01/2020 1502   LYMPHSABS 3.1 02/22/2020 1103   MONOABS 0.6 02/22/2020 1103   EOSABS 0.2 02/22/2020 1103   BASOSABS 0.1 02/22/2020 1103    CMP     Component Value Date/Time   NA 145 09/01/2020 1502   K 3.7 09/01/2020 1502   CL 110 09/01/2020 1502   CO2 30 09/01/2020 1502   GLUCOSE 88 09/01/2020 1502   BUN 11 09/01/2020 1502   CREATININE 0.87 09/01/2020 1502   CALCIUM 9.1 09/01/2020 1502   PROT 7.0 09/01/2020 1502   ALBUMIN 3.9 09/01/2020 1502   AST 19 09/01/2020 1502   ALT 14 09/01/2020 1502   ALKPHOS 56 09/01/2020 1502   BILITOT 0.4 09/01/2020 1502   GFRNONAA >60 09/01/2020 1502   GFRAA >60 02/13/2019 0528     Assessment/Plan:   52 year old female with intermittent constipation ongoing for 1 year in the setting of chronic Percocet use.  No previous colonoscopy.  Family history of colon cancer in maternal grandmother in her 62s to 50s.  BMI greater than 50.  Constipation Change in bowel habits Suspect worsening constipation over the last year secondary to possible stress from father dying and  her attempts to change her diet for weight loss.  Obvious barrier of chronic narcotic use as well.  - Colonoscopy for further evaluation, must be done at hospital due to BMI - Recommend fiber - Recommend MiraLAX 1 capful per day and titrate based on response - She will send me a MyChart message with an update on her symptoms within the next 2 weeks - I thoroughly discussed the procedure with the patient (at bedside) to include nature of the procedure, alternatives, benefits, and risks (including but not limited to bleeding, infection, perforation, anesthesia/cardiac pulmonary complications).  Patient verbalized understanding and gave verbal consent to proceed with procedure. - Follow-up per colonoscopy  [facility] 11/14/2022, 2:41 PM  Cc: Richmond Campbell., PA-C

## 2022-11-14 NOTE — Patient Instructions (Addendum)
Start taking Miralax 1 capful (17 grams) 1x / day for 1 week.   If this is not effective, increase to 1 dose 2x / day for 1 week.   If this is still not effective, increase to two capfuls (34 grams) 2x / day.   Can adjust dose as needed based on response. Can take 1/2 cap daily, skip days, or increase per day.    Benefiber/meatmucil daily  You have been scheduled for a colonoscopy. Please follow written instructions given to you at your visit today.   Please pick up your prep supplies at the pharmacy within the next 1-3 days.  If you use inhalers (even only as needed), please bring them with you on the day of your procedure.  DO NOT TAKE 7 DAYS PRIOR TO TEST- Trulicity (dulaglutide) Ozempic, Wegovy (semaglutide) Mounjaro (tirzepatide) Bydureon Bcise (exanatide extended release)  DO NOT TAKE 1 DAY PRIOR TO YOUR TEST Rybelsus (semaglutide) Adlyxin (lixisenatide) Victoza (liraglutide) Byetta (exanatide)  We have sent the following medications to your pharmacy for you to pick up at your convenience:  Suprep  If your blood pressure at your visit was 140/90 or greater, please contact your primary care physician to follow up on this.  _______________________________________________________  If you are age 54 or older, your body mass index should be between 23-30. Your Body mass index is 50.81 kg/m. If this is out of the aforementioned range listed, please consider follow up with your Primary Care Provider.  If you are age 54 or younger, your body mass index should be between 19-25. Your Body mass index is 50.81 kg/m. If this is out of the aformentioned range listed, please consider follow up with your Primary Care Provider.   ________________________________________________________  The [place] [facility] providers would like to encourage you to use [facility] to communicate with providers for non-urgent requests or questions.  Due to long hold times on the telephone, sending your provider a  message by [facility] may be a faster and more efficient way to get a response.  Please allow 48 business hours for a response.  Please remember that this is for non-urgent requests.    Due to recent changes in healthcare laws, you may see the results of your imaging and laboratory studies on MyChart before your provider has had a chance to review them.  We understand that in some cases there may be results that are confusing or concerning to you. Not all laboratory results come back in the same time frame and the provider may be waiting for multiple results in order to interpret others.  Please give [place] 48 hours in order for your provider to thoroughly   Thank you for entrusting me with your care and choosing [facility].  Bayley McMichael PA-C

## 2022-11-18 NOTE — Progress Notes (Signed)
____________________________________________________________  Attending physician addendum:  Thank you for sending this case to me. I have reviewed the entire note and agree with the plan.  [employer] is another option for her OIC. She will have to go on the hospital outpatient procedure waiting list, which is currently booking out several months from now.  Amada Jupiter, MD  ____________________________________________________________

## 2023-01-22 NOTE — Progress Notes (Signed)
Anesthesia Review:  PCP: Clovia Cuff  Cardiologist : Chest x-ray : EKG : Echo : 2021  Stress test: Cardiac Cath :  Activity level:  Sleep Study/ CPAP : Fasting Blood Sugar :      / Checks Blood Sugar -- times a day:   Blood Thinner/ Instructions /Last Dose: ASA / Instructions/ Last Dose :    01/12/23- IN ED  01/16/23- CMP , CBC/DIFF  01/16/23- hgba1c- 6.1

## 2023-01-27 ENCOUNTER — Telehealth: Payer: Self-pay | Admitting: Gastroenterology

## 2023-01-27 NOTE — Telephone Encounter (Signed)
Requesting call back to discuss rescheduling hospital procedure due to being out of town for upcoming apt on 12/5. Please advise.

## 2023-01-27 NOTE — Telephone Encounter (Signed)
Left message for patient to call back. If patient needs to cancel her procedure with Dr Myrtie Neither for 01/30/23, it will likely be several months before she is able to be seen. There is currently a hospital wait list.

## 2023-01-28 NOTE — Telephone Encounter (Signed)
That is an unfortunate short-notice cancellation for a hospital procedure.  Please add her to my hospital waiting list.  Thanks for checking, but I do not have a patient who can be added on for this week (no EGDs, and I checked with partners recently to see if others had hospital cases to do)  Since she was the 730 case, please move up the other cases in that block so we can still start at 7:30 for best anesthesia availability.   - HD

## 2023-01-28 NOTE — Telephone Encounter (Signed)
Procedure cancelled for 01/30/23; per scheduling, they have moved all patient procedures up.

## 2023-01-30 ENCOUNTER — Ambulatory Visit (HOSPITAL_COMMUNITY)
Admission: RE | Admit: 2023-01-30 | Payer: Managed Care, Other (non HMO) | Source: Home / Self Care | Admitting: Gastroenterology

## 2023-01-30 ENCOUNTER — Encounter (HOSPITAL_COMMUNITY): Admission: RE | Payer: Self-pay | Source: Home / Self Care

## 2023-01-30 SURGERY — COLONOSCOPY WITH PROPOFOL
Anesthesia: Monitor Anesthesia Care

## 2023-06-16 ENCOUNTER — Encounter: Payer: Self-pay | Admitting: Urology
